# Patient Record
Sex: Female | Born: 1995 | Race: Black or African American | Hispanic: No | Marital: Single | Smoking: Never smoker
Health system: Southern US, Community
[De-identification: ages and names within clinical notes are randomized; demographics above are authoritative.]

## PROBLEM LIST (undated history)

## (undated) ENCOUNTER — Inpatient Hospital Stay (HOSPITAL_COMMUNITY): Payer: Self-pay

## (undated) ENCOUNTER — Ambulatory Visit (HOSPITAL_COMMUNITY): Admission: EM | Payer: Self-pay

## (undated) DIAGNOSIS — J40 Bronchitis, not specified as acute or chronic: Secondary | ICD-10-CM

## (undated) DIAGNOSIS — N390 Urinary tract infection, site not specified: Secondary | ICD-10-CM

## (undated) DIAGNOSIS — R519 Headache, unspecified: Secondary | ICD-10-CM

## (undated) DIAGNOSIS — J45909 Unspecified asthma, uncomplicated: Secondary | ICD-10-CM

## (undated) HISTORY — PX: NO PAST SURGERIES: SHX2092

---
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
## 2015-05-21 ENCOUNTER — Encounter (HOSPITAL_COMMUNITY): Payer: Self-pay | Admitting: *Deleted

## 2015-10-13 ENCOUNTER — Emergency Department (HOSPITAL_COMMUNITY)
Admission: EM | Admit: 2015-10-13 | Discharge: 2015-10-14 | Disposition: A | Discharge status: Home / Self Care | Payer: Medicaid Other | Attending: Emergency Medicine | Admitting: Emergency Medicine

## 2015-10-13 ENCOUNTER — Other Ambulatory Visit: Payer: Self-pay

## 2015-10-13 ENCOUNTER — Emergency Department (HOSPITAL_COMMUNITY): Payer: Medicaid Other

## 2015-10-13 ENCOUNTER — Encounter (HOSPITAL_COMMUNITY): Payer: Self-pay | Admitting: Emergency Medicine

## 2015-10-13 DIAGNOSIS — R51 Headache: Secondary | ICD-10-CM | POA: Insufficient documentation

## 2015-10-13 DIAGNOSIS — N390 Urinary tract infection, site not specified: Secondary | ICD-10-CM

## 2015-10-13 DIAGNOSIS — H539 Unspecified visual disturbance: Secondary | ICD-10-CM | POA: Diagnosis not present

## 2015-10-13 DIAGNOSIS — R55 Syncope and collapse: Secondary | ICD-10-CM | POA: Diagnosis present

## 2015-10-13 DIAGNOSIS — J45909 Unspecified asthma, uncomplicated: Secondary | ICD-10-CM | POA: Insufficient documentation

## 2015-10-13 DIAGNOSIS — Z87891 Personal history of nicotine dependence: Secondary | ICD-10-CM | POA: Insufficient documentation

## 2015-10-13 DIAGNOSIS — R42 Dizziness and giddiness: Secondary | ICD-10-CM | POA: Insufficient documentation

## 2015-10-13 DIAGNOSIS — Z79899 Other long term (current) drug therapy: Secondary | ICD-10-CM | POA: Insufficient documentation

## 2015-10-13 DIAGNOSIS — R519 Headache, unspecified: Secondary | ICD-10-CM

## 2015-10-13 LAB — BASIC METABOLIC PANEL
ANION GAP: 7 (ref 5–15)
BUN: 13 mg/dL (ref 6–20)
CO2: 26 mmol/L (ref 22–32)
Calcium: 9.1 mg/dL (ref 8.9–10.3)
Chloride: 101 mmol/L (ref 101–111)
Creatinine, Ser: 0.95 mg/dL (ref 0.44–1.00)
Glucose, Bld: 157 mg/dL — ABNORMAL HIGH (ref 65–99)
POTASSIUM: 3.7 mmol/L (ref 3.5–5.1)
Sodium: 134 mmol/L — ABNORMAL LOW (ref 135–145)

## 2015-10-13 LAB — CBC
HEMATOCRIT: 37.2 % (ref 36.0–46.0)
HEMOGLOBIN: 12.4 g/dL (ref 12.0–15.0)
MCH: 27.1 pg (ref 26.0–34.0)
MCHC: 33.3 g/dL (ref 30.0–36.0)
MCV: 81.2 fL (ref 78.0–100.0)
Platelets: 207 10*3/uL (ref 150–400)
RBC: 4.58 MIL/uL (ref 3.87–5.11)
RDW: 12.6 % (ref 11.5–15.5)
WBC: 5.2 10*3/uL (ref 4.0–10.5)

## 2015-10-13 LAB — URINALYSIS, ROUTINE W REFLEX MICROSCOPIC
Bilirubin Urine: NEGATIVE
Glucose, UA: NEGATIVE mg/dL
Ketones, ur: NEGATIVE mg/dL
NITRITE: POSITIVE — AB
Protein, ur: NEGATIVE mg/dL
SPECIFIC GRAVITY, URINE: 1.016 (ref 1.005–1.030)
pH: 7 (ref 5.0–8.0)

## 2015-10-13 LAB — CBG MONITORING, ED: GLUCOSE-CAPILLARY: 94 mg/dL (ref 65–99)

## 2015-10-13 LAB — URINE MICROSCOPIC-ADD ON: RBC / HPF: NONE SEEN RBC/hpf (ref 0–5)

## 2015-10-13 MED ORDER — KETOROLAC TROMETHAMINE 30 MG/ML IJ SOLN
30.0000 mg | Freq: Once | INTRAMUSCULAR | Status: AC
  Administered 2015-10-13: 30 mg via INTRAVENOUS
  Filled 2015-10-13: qty 1

## 2015-10-13 MED ORDER — ACETAMINOPHEN 325 MG PO TABS
650.0000 mg | ORAL_TABLET | Freq: Once | ORAL | Status: AC
  Administered 2015-10-14: 650 mg via ORAL
  Filled 2015-10-13: qty 2

## 2015-10-13 MED ORDER — DEXTROSE 5 % IV SOLN
1.0000 g | Freq: Once | INTRAVENOUS | Status: AC
  Administered 2015-10-13: 1 g via INTRAVENOUS
  Filled 2015-10-13: qty 10

## 2015-10-13 MED ORDER — SODIUM CHLORIDE 0.9 % IV BOLUS (SEPSIS)
1000.0000 mL | Freq: Once | INTRAVENOUS | Status: AC
  Administered 2015-10-13: 1000 mL via INTRAVENOUS

## 2015-10-13 NOTE — ED Provider Notes (Signed)
CSN: [id number]     Arrival date & time 10/13/15  1729 History   First MD Initiated Contact with Patient 10/13/15 2112     Chief Complaint  Patient presents with  . Near Syncope  . Migraine  . Fever   HPI  Ms. Jennifer Bird is a 20 year old female with PMHx of asthma presenting with headache and syncopal event. Pt reports onset of headache 4 days ago. The headache is generalized and described as "like a migraine". Pt is not able to better characterize the pain other than "it's just weird feeling, like it hurts real bad". The headache has been intermittent over the past few days without specific exacerbating factors. She notes that when she has the headache, it is worsened by leaning forward, lying flat and coughing. She denies associated neck pain or stiffness. She also complains of intermittent blurred vision. She states that the vision changes will last for a few seconds and she blinks to resolve it. She reports a fever of 100.5 yesterday and chills. 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 Her coworkers report that she then passed out and fell to the floor. Is is unclear how long the pt was unconsciousness for. Pt states coworkers told her she was unconscious for 30 minutes but she also reports that they were giving her cold water to drink after she passed out which she drank without issue. She also reports the coworkers performing CPR on her to "wake her up" but denies coworkers reporting her not breathing or pulseless. EMS was not called and pt went home. No change in her headaches after this event. She has not taken any OTC medications for her headaches. She endorses an associated dry cough over the past few days. Denies other symptoms of URI including ear pain, nasal congestion, rhinorrhea, sinus pressure or sore throat. Denies urinary symptoms including dysuria or increased urinary frequency. Denies nausea or vomiting. No other complaints today.   Past Medical History   Diagnosis Date  . Asthma   . Bronchitis    History reviewed. No pertinent past surgical history. No family history on file. Social History  Substance Use Topics  . Smoking status: Former Smoker    Types: Cigarettes  . Smokeless tobacco: None  . Alcohol Use: No   OB History    Gravida Para Term Preterm AB TAB SAB Ectopic Multiple Living   2              Review of Systems  Constitutional: Positive for fever and chills.  HENT: Negative for congestion, rhinorrhea and sore throat.   Eyes: Positive for visual disturbance.  Respiratory: Negative for cough.   Gastrointestinal: Negative for nausea, vomiting and abdominal pain.  Musculoskeletal: Negative for gait problem, neck pain and neck stiffness.  Skin: Negative for rash.  Neurological: Positive for syncope, light-headedness and headaches. Negative for seizures, facial asymmetry, speech difficulty, weakness and numbness.  Psychiatric/Behavioral: Negative for confusion.  All other systems reviewed and are negative.     Allergies  Other  Home Medications   Prior to Admission medications   Medication Sig Start Date End Date Taking? Authorizing Provider  albuterol (PROVENTIL HFA;VENTOLIN HFA) 108 (90 BASE) MCG/ACT inhaler Inhale 2 puffs into the lungs every 4 (four) hours as needed for wheezing or shortness of breath.   Yes Historical Provider, MD  Multiple Vitamin (MULTIVITAMIN WITH MINERALS) TABS tablet Take 1 tablet by mouth daily.   Yes Historical Provider, MD  cephALEXin (  KEFLEX) 500 MG capsule Take 1 capsule (500 mg total) by mouth 2 (two) times daily. 10/14/15   Rolm GalaStevi Lailie Smead, PA-C  Prenatal Vit-Fe Fumarate-FA (PRENATAL COMPLETE) 14-0.4 MG TABS Take 1 tablet by mouth daily. Patient not taking: Reported on 07/16/2014 06/21/14   Junius FinnerErin O'Malley, PA-C   BP 102/69 mmHg  Pulse 96  Temp(Src) 100.3 F (37.9 C) (Oral)  Resp 18  Ht 5\' 10"  (1.778 m)  Wt 84.369 kg  BMI 26.69 kg/m2  SpO2 99%  LMP 09/24/2015 Physical Exam   Constitutional: She is oriented to person, place, and time. She appears well-developed and well-nourished. No distress.  Nontoxic appearing  HENT:  Head: Normocephalic and atraumatic.  Mouth/Throat: Oropharynx is clear and moist. No oropharyngeal exudate.  Eyes: Conjunctivae and EOM are normal. Pupils are equal, round, and reactive to light. Right eye exhibits no discharge. Left eye exhibits no discharge.  Neck: Normal range of motion. Neck supple. No rigidity. No Brudzinski's sign and no Kernig's sign noted.  No nuchal rigidity. FROM of neck intact including rotation and chin to chest. Negative Brudzinki's and Kernig's.   Cardiovascular: Normal rate, regular rhythm and normal heart sounds.   Pulmonary/Chest: Effort normal and breath sounds normal. No respiratory distress.  Abdominal: Soft. There is no tenderness. There is no rebound and no guarding.  Musculoskeletal: Normal range of motion.  Neurological: She is alert and oriented to person, place, and time. No cranial nerve deficit. She exhibits normal muscle tone. Coordination normal.  Cranial nerves 3-12 tested and intact. 5/5 strength of all major muscle groups. Sensation to light touch intact throughout. Finger to nose coordinated. Walks with a steady gait unassisted.  Skin: Skin is warm and dry.  Psychiatric: She has a normal mood and affect. Her behavior is normal.  Nursing note and vitals reviewed.   ED Course  Procedures (including critical care time) Labs Review Labs Reviewed  BASIC METABOLIC PANEL - Abnormal; Notable for the following:    Sodium 134 (*)    Glucose, Bld 157 (*)    All other components within normal limits  URINALYSIS, ROUTINE W REFLEX MICROSCOPIC (NOT AT [facility]) - Abnormal; Notable for the following:    APPearance CLOUDY (*)    Hgb urine dipstick SMALL (*)    Nitrite POSITIVE (*)    Leukocytes, UA LARGE (*)    All other components within normal limits  URINE MICROSCOPIC-ADD ON - Abnormal; Notable for the  following:    Squamous Epithelial / LPF 0-5 (*)    Bacteria, UA FEW (*)    All other components within normal limits  CBC  PREGNANCY, URINE  CBG MONITORING, ED    Imaging Review Ct Head Wo Contrast  10/13/2015  CLINICAL DATA:  20 year old female with headache and syncope EXAM: CT HEAD WITHOUT CONTRAST TECHNIQUE: Contiguous axial images were obtained from the base of the skull through the vertex without intravenous contrast. COMPARISON:  None. FINDINGS: The ventricles and the sulci are appropriate in size for the patient's age. There is no intracranial hemorrhage. No midline shift or mass effect identified. The gray-white matter differentiation is preserved. The visualized paranasal sinuses and mastoid air cells are well aerated. The calvarium is intact. IMPRESSION: No acute intracranial pathology. Electronically Signed   By: Elgie CollardArash  Radparvar M.D.   On: 10/13/2015 22:03   I have personally reviewed and evaluated these images and lab results as part of my medical decision-making.   EKG Interpretation None      MDM   Final diagnoses:  UTI (lower urinary tract infection)  Headache, unspecified headache type   20 year old female presenting with low grade fever, headache and possible syncopal event yesterday. Low grade temperature of 100.1 on presentation. No antipyretics PTA. Non-focal neuro exam. No meningeal signs. FROM of neck intact. No rigidity. Abdomen is soft, non-tender. No leukocytosis. Blood work unremarkable. CT head negative. ECG without arrhythmia. UA positive for nitrites and large leuks. IVF and toradol given which pt reports improved her headache. Dose of rocephin given in ED. Will treat for UTI and have pt closely follow up with PCP in 3-4 days . 5+ minutes discussing strict return precautions with pt including worsening headache, neck pain, neck stiffness, persistent fever, recurrent vision changes, confusion, recurrent syncope, gait imbalance, weakness or any other new  symptoms. Pt states understanding and is stable for discharge.    Rolm Gala Irl Bodie, PA-C 10/14/15 1448  Benjiman Core, MD 10/15/15 [phone]

## 2015-10-13 NOTE — ED Notes (Signed)
Patient reports syncopal episode yesterday at work, LOC for 30 minutes coworkers reported. Patient c/o blurred vision and migraine x4 days, subjective fever, non productive cough.

## 2015-10-14 LAB — PREGNANCY, URINE: Preg Test, Ur: NEGATIVE

## 2015-10-14 MED ORDER — CEPHALEXIN 500 MG PO CAPS: 500.0000 mg | ORAL_CAPSULE | Freq: Two times a day (BID) | ORAL | Status: DC

## 2015-10-14 NOTE — Discharge Instructions (Signed)
Urinary Tract Infection Urinary tract infections (UTIs) can develop anywhere along your urinary tract. Your urinary tract is your body's drainage system for removing wastes and extra water. Your urinary tract includes two kidneys, two ureters, a bladder, and a urethra. Your kidneys are a pair of bean-shaped organs. Each kidney is about the size of your fist. They are located below your ribs, one on each side of your spine. CAUSES Infections are caused by microbes, which are microscopic organisms, including fungi, viruses, and bacteria. These organisms are so small that they can only be seen through a microscope. Bacteria are the microbes that most commonly cause UTIs. SYMPTOMS  Symptoms of UTIs may vary by age and gender of the patient and by the location of the infection. Symptoms in young women typically include a frequent and intense urge to urinate and a painful, burning feeling in the bladder or urethra during urination. Older women and men are more likely to be tired, shaky, and weak and have muscle aches and abdominal pain. A fever may mean the infection is in your kidneys. Other symptoms of a kidney infection include pain in your back or sides below the ribs, nausea, and vomiting. DIAGNOSIS To diagnose a UTI, your caregiver will ask you about your symptoms. Your caregiver will also ask you to provide a urine sample. The urine sample will be tested for bacteria and white blood cells. White blood cells are made by your body to help fight infection. TREATMENT  Typically, UTIs can be treated with medication. Because most UTIs are caused by a bacterial infection, they usually can be treated with the use of antibiotics. The choice of antibiotic and length of treatment depend on your symptoms and the type of bacteria causing your infection. HOME CARE INSTRUCTIONS  If you were prescribed antibiotics, take them exactly as your caregiver instructs you. Finish the medication even if you feel better after  you have only taken some of the medication.  Drink enough water and fluids to keep your urine clear or pale yellow.  Avoid caffeine, tea, and carbonated beverages. They tend to irritate your bladder.  Empty your bladder often. Avoid holding urine for long periods of time.  Empty your bladder before and after sexual intercourse.  After a bowel movement, women should cleanse from front to back. Use each tissue only once. SEEK MEDICAL CARE IF:   You have back pain.  You develop a fever.  Your symptoms do not begin to resolve within 3 days. SEEK IMMEDIATE MEDICAL CARE IF:   You have severe back pain or lower abdominal pain.  You develop chills.  You have nausea or vomiting.  You have continued burning or discomfort with urination. MAKE SURE YOU:   Understand these instructions.  Will watch your condition.  Will get help right away if you are not doing well or get worse.   This information is not intended to replace advice given to you by your health care provider. Make sure you discuss any questions you have with your health care provider.   Document Released: 01/17/2005 Document Revised: 12/29/2014 Document Reviewed: 05/18/2011 Elsevier Interactive Patient Education 2016 Elsevier Inc.  General Headache Without Cause A headache is pain or discomfort felt around the head or neck area. The specific cause of a headache may not be found. There are many causes and types of headaches. A few common ones are:  Tension headaches.  Migraine headaches.  Cluster headaches.  Chronic daily headaches. HOME CARE INSTRUCTIONS  Watch your condition  for any changes. Take these steps to help with your condition: Managing Pain  Take over-the-counter and prescription medicines only as told by your health care provider.  Lie down in a dark, quiet room when you have a headache.  If directed, apply ice to the head and neck area:  Put ice in a plastic bag.  Place a towel between your  skin and the bag.  Leave the ice on for 20 minutes, 2-3 times per day.  Use a heating pad or hot shower to apply heat to the head and neck area as told by your health care provider.  Keep lights dim if bright lights bother you or make your headaches worse. Eating and Drinking  Eat meals on a regular schedule.  Limit alcohol use.  Decrease the amount of caffeine you drink, or stop drinking caffeine. General Instructions  Keep all follow-up visits as told by your health care provider. This is important.  Keep a headache journal to help find out what may trigger your headaches. For example, write down:  What you eat and drink.  How much sleep you get.  Any change to your diet or medicines.  Try massage or other relaxation techniques.  Limit stress.  Sit up straight, and do not tense your muscles.  Do not use tobacco products, including cigarettes, chewing tobacco, or e-cigarettes. If you need help quitting, ask your health care provider.  Exercise regularly as told by your health care provider.  Sleep on a regular schedule. Get 7-9 hours of sleep, or the amount recommended by your health care provider. SEEK MEDICAL CARE IF:   Your symptoms are not helped by medicine.  You have a headache that is different from the usual headache.  You have nausea or you vomit.  You have a fever. SEEK IMMEDIATE MEDICAL CARE IF:   Your headache becomes severe.  You have repeated vomiting.  You have a stiff neck.  You have a loss of vision.  You have problems with speech.  You have pain in the eye or ear.  You have muscular weakness or loss of muscle control.  You lose your balance or have trouble walking.  You feel faint or pass out.  You have confusion.   This information is not intended to replace advice given to you by your health care provider. Make sure you discuss any questions you have with your health care provider.   Document Released: 04/09/2005 Document  Revised: 12/29/2014 Document Reviewed: 08/02/2014 Elsevier Interactive Patient Education [employer] Elsevier Inc.

## 2015-10-17 ENCOUNTER — Encounter (HOSPITAL_BASED_OUTPATIENT_CLINIC_OR_DEPARTMENT_OTHER): Payer: Self-pay | Admitting: Emergency Medicine

## 2016-01-16 ENCOUNTER — Encounter (HOSPITAL_COMMUNITY): Payer: Self-pay | Admitting: *Deleted

## 2016-01-16 ENCOUNTER — Inpatient Hospital Stay (HOSPITAL_COMMUNITY)
Admission: AD | Admit: 2016-01-16 | Discharge: 2016-01-16 | Disposition: A | Discharge status: Home / Self Care | Payer: Medicaid Other | Source: Ambulatory Visit | Attending: Family Medicine | Admitting: Family Medicine

## 2016-01-16 DIAGNOSIS — R102 Pelvic and perineal pain: Secondary | ICD-10-CM | POA: Insufficient documentation

## 2016-01-16 DIAGNOSIS — Z87891 Personal history of nicotine dependence: Secondary | ICD-10-CM | POA: Diagnosis not present

## 2016-01-16 DIAGNOSIS — A599 Trichomoniasis, unspecified: Secondary | ICD-10-CM

## 2016-01-16 DIAGNOSIS — A5901 Trichomonal vulvovaginitis: Secondary | ICD-10-CM | POA: Diagnosis not present

## 2016-01-16 DIAGNOSIS — Z3202 Encounter for pregnancy test, result negative: Secondary | ICD-10-CM | POA: Insufficient documentation

## 2016-01-16 LAB — URINALYSIS, ROUTINE W REFLEX MICROSCOPIC
Bilirubin Urine: NEGATIVE
GLUCOSE, UA: NEGATIVE mg/dL
Hgb urine dipstick: NEGATIVE
Ketones, ur: NEGATIVE mg/dL
LEUKOCYTES UA: NEGATIVE
Nitrite: NEGATIVE
PH: 6 (ref 5.0–8.0)
PROTEIN: NEGATIVE mg/dL
SPECIFIC GRAVITY, URINE: 1.02 (ref 1.005–1.030)

## 2016-01-16 LAB — CBC
HCT: 37 % (ref 36.0–46.0)
Hemoglobin: 12.9 g/dL (ref 12.0–15.0)
MCH: 27.7 pg (ref 26.0–34.0)
MCHC: 34.9 g/dL (ref 30.0–36.0)
MCV: 79.6 fL (ref 78.0–100.0)
PLATELETS: 247 10*3/uL (ref 150–400)
RBC: 4.65 MIL/uL (ref 3.87–5.11)
RDW: 12.9 % (ref 11.5–15.5)
WBC: 6.3 10*3/uL (ref 4.0–10.5)

## 2016-01-16 LAB — WET PREP, GENITAL
SPERM: NONE SEEN
Yeast Wet Prep HPF POC: NONE SEEN

## 2016-01-16 LAB — POCT PREGNANCY, URINE: Preg Test, Ur: NEGATIVE

## 2016-01-16 MED ORDER — METRONIDAZOLE 500 MG PO TABS
2000.0000 mg | ORAL_TABLET | Freq: Once | ORAL | Status: AC
  Administered 2016-01-16: 2000 mg via ORAL
  Filled 2016-01-16: qty 4

## 2016-01-16 MED ORDER — KETOROLAC TROMETHAMINE 60 MG/2ML IM SOLN
60.0000 mg | Freq: Once | INTRAMUSCULAR | Status: AC
  Administered 2016-01-16: 60 mg via INTRAMUSCULAR
  Filled 2016-01-16: qty 2

## 2016-01-16 NOTE — Discharge Instructions (Signed)
Trichomoniasis Trichomoniasis is an infection caused by an organism called Trichomonas. The infection can affect both women and men. In women, the outer female genitalia and the vagina are affected. In men, the penis is mainly affected, but the prostate and other reproductive organs can also be involved. Trichomoniasis is a sexually transmitted infection (STI) and is most often passed to another person through sexual contact.  RISK FACTORS  Having unprotected sexual intercourse.  Having sexual intercourse with an infected partner. SIGNS AND SYMPTOMS  Symptoms of trichomoniasis in women include:  Abnormal gray-green frothy vaginal discharge.  Itching and irritation of the vagina.  Itching and irritation of the area outside the vagina. Symptoms of trichomoniasis in men include:   Penile discharge with or without pain.  Pain during urination. This results from inflammation of the urethra. DIAGNOSIS  Trichomoniasis may be found during a Pap test or physical exam. Your health care provider may use one of the following methods to help diagnose this infection:  Testing the pH of the vagina with a test tape.  Using a vaginal swab test that checks for the Trichomonas organism. A test is available that provides results within a few minutes.  Examining a urine sample.  Testing vaginal secretions. Your health care provider may test you for other STIs, including HIV. TREATMENT   You may be given medicine to fight the infection. Women should inform their health care provider if they could be or are pregnant. Some medicines used to treat the infection should not be taken during pregnancy.  Your health care provider may recommend over-the-counter medicines or creams to decrease itching or irritation.  Your sexual partner will need to be treated if infected.  Your health care provider may test you for infection again 3 months after treatment. HOME CARE INSTRUCTIONS   Take medicines only as  directed by your health care provider.  Take over-the-counter medicine for itching or irritation as directed by your health care provider.  Do not have sexual intercourse while you have the infection.  Women should not douche or wear tampons while they have the infection.  Discuss your infection with your partner. Your partner may have gotten the infection from you, or you may have gotten it from your partner.  Have your sex partner get examined and treated if necessary.  Practice safe, informed, and protected sex.  See your health care provider for other STI testing. SEEK MEDICAL CARE IF:   You still have symptoms after you finish your medicine.  You develop abdominal pain.  You have pain when you urinate.  You have bleeding after sexual intercourse.  You develop a rash.  Your medicine makes you sick or makes you throw up (vomit). MAKE SURE YOU:  Understand these instructions.  Will watch your condition.  Will get help right away if you are not doing well or get worse.   This information is not intended to replace advice given to you by your health care provider. Make sure you discuss any questions you have with your health care provider.   Document Released: 10/03/2000 Document Revised: 04/30/2014 Document Reviewed: 01/19/2013 Elsevier Interactive Patient Education 2016 Elsevier Inc.  

## 2016-01-16 NOTE — MAU Provider Note (Signed)
History     CSN: [id number]  Arrival date and time: 01/16/16 8657   First Provider Initiated Contact with Patient 01/16/16 2054      Chief Complaint  Patient presents with  . Pelvic Pain   Pelvic Pain  The patient's primary symptoms include pelvic pain. This is a new problem. The current episode started in the past 7 days. The problem occurs intermittently. The problem has been unchanged. Pain severity now: 6/10  The problem affects the right side. She is not pregnant. Associated symptoms include abdominal pain, nausea and vomiting. Pertinent negatives include no chills, constipation, diarrhea, dysuria, fever, frequency or urgency. The vaginal discharge was normal. There has been no bleeding. Nothing aggravates the symptoms. She has tried nothing for the symptoms. She is sexually active. She uses nothing for contraception. Her menstrual history has been regular (LMP: 12/11/15 ).    Past Medical History:  Diagnosis Date  . Asthma   . Bronchitis     History reviewed. No pertinent surgical history.  History reviewed. No pertinent family history.  Social History  Substance Use Topics  . Smoking status: Former Smoker    Types: Cigarettes  . Smokeless tobacco: Never Used  . Alcohol use No    Allergies:  Allergies  Allergen Reactions  . Other Anaphylaxis    Ketchup, mustard, mayo    Prescriptions Prior to Admission  Medication Sig Dispense Refill Last Dose  . albuterol (PROVENTIL HFA;VENTOLIN HFA) 108 (90 BASE) MCG/ACT inhaler Inhale 2 puffs into the lungs every 4 (four) hours as needed for wheezing or shortness of breath.   Past Month at Unknown time  . cephALEXin (KEFLEX) 500 MG capsule Take 1 capsule (500 mg total) by mouth 2 (two) times daily. 14 capsule 0   . Multiple Vitamin (MULTIVITAMIN WITH MINERALS) TABS tablet Take 1 tablet by mouth daily.   Past Month at Unknown time  . Prenatal Vit-Fe Fumarate-FA (PRENATAL COMPLETE) 14-0.4 MG TABS Take 1 tablet by mouth daily.  (Patient not taking: Reported on 07/16/2014) 30 each 0 Not Taking at Unknown time    Review of Systems  Constitutional: Negative for chills and fever.  Gastrointestinal: Positive for abdominal pain, nausea and vomiting. Negative for constipation and diarrhea.  Genitourinary: Positive for pelvic pain. Negative for dysuria, frequency and urgency.   Physical Exam   Blood pressure 117/72, pulse 79, temperature 99.4 F (37.4 C), temperature source Oral, resp. rate 18, height 5\' 9"  (1.753 m), weight 199 lb (90.3 kg), last menstrual period 12/11/2015, SpO2 100 %, unknown if currently breastfeeding.  Physical Exam  Nursing note and vitals reviewed. Constitutional: She is oriented to person, place, and time. She appears well-developed and well-nourished. No distress.  HENT:  Head: Normocephalic.  Cardiovascular: Normal rate.   Respiratory: Effort normal.  GI: Soft. There is no tenderness. There is no rebound.  Genitourinary:  Genitourinary Comments:  External: no lesion Vagina: large amount of white discharge Cervix: pink, smooth, no CMT Uterus: NSSC Adnexa: NT   Neurological: She is alert and oriented to person, place, and time.  Skin: Skin is warm and dry.  Psychiatric: She has a normal mood and affect.   Results for orders placed or performed during the hospital encounter of 01/16/16 (from the past 24 hour(s))  Urinalysis, Routine w reflex microscopic (not at [facility])     Status: None   Collection Time: 01/16/16  8:07 PM  Result Value Ref Range   Color, Urine YELLOW YELLOW   APPearance CLEAR CLEAR  Specific Gravity, Urine 1.020 1.005 - 1.030   pH 6.0 5.0 - 8.0   Glucose, UA NEGATIVE NEGATIVE mg/dL   Hgb urine dipstick NEGATIVE NEGATIVE   Bilirubin Urine NEGATIVE NEGATIVE   Ketones, ur NEGATIVE NEGATIVE mg/dL   Protein, ur NEGATIVE NEGATIVE mg/dL   Nitrite NEGATIVE NEGATIVE   Leukocytes, UA NEGATIVE NEGATIVE  Pregnancy, urine POC     Status: None   Collection Time: 01/16/16   8:11 PM  Result Value Ref Range   Preg Test, Ur NEGATIVE NEGATIVE  CBC     Status: None   Collection Time: 01/16/16  8:59 PM  Result Value Ref Range   WBC 6.3 4.0 - 10.5 K/uL   RBC 4.65 3.87 - 5.11 MIL/uL   Hemoglobin 12.9 12.0 - 15.0 g/dL   HCT 16.137.0 09.636.0 - 04.546.0 %   MCV 79.6 78.0 - 100.0 fL   MCH 27.7 26.0 - 34.0 pg   MCHC 34.9 30.0 - 36.0 g/dL   RDW 40.912.9 81.111.5 - 91.415.5 %   Platelets 247 150 - 400 K/uL  Wet prep, genital     Status: Abnormal   Collection Time: 01/16/16  9:00 PM  Result Value Ref Range   Yeast Wet Prep HPF POC NONE SEEN NONE SEEN   Trich, Wet Prep PRESENT (A) NONE SEEN   Clue Cells Wet Prep HPF POC PRESENT (A) NONE SEEN   WBC, Wet Prep HPF POC MODERATE (A) NONE SEEN   Sperm NONE SEEN     MAU Course  Procedures  MDM Patient treated in MAU with 2g of flagyl.   Assessment and Plan   1. Trichimoniasis   2. Pelvic pain in female    DC home Comfort measures reviewed  RX: none, discussed partner treatment. Patient declines rx for partner today, and plans to have partner FU with health department   Follow-up Information    [facility]   Contact information: [address] [place] [id number] [phone]            Tawnya CrookHogan, Ryder Man Donovan 01/16/2016, 8:56 PM

## 2016-01-16 NOTE — MAU Note (Signed)
Pt reports she has had vomiting off/on since Saturday, also reports she has been having lower abd pain off/on

## 2016-01-17 LAB — GC/CHLAMYDIA PROBE AMP (~~LOC~~) NOT AT ARMC
CHLAMYDIA, DNA PROBE: POSITIVE — AB
NEISSERIA GONORRHEA: NEGATIVE

## 2016-01-17 LAB — RPR: RPR Ser Ql: NONREACTIVE

## 2016-01-17 LAB — HIV ANTIBODY (ROUTINE TESTING W REFLEX): HIV Screen 4th Generation wRfx: NONREACTIVE

## 2016-01-18 ENCOUNTER — Telehealth: Payer: Self-pay | Admitting: *Deleted

## 2016-01-18 DIAGNOSIS — A749 Chlamydial infection, unspecified: Secondary | ICD-10-CM

## 2016-01-18 MED ORDER — AZITHROMYCIN 250 MG PO TABS: 1000.0000 mg | ORAL_TABLET | Freq: Once | ORAL | 0 refills | Status: AC

## 2016-01-18 NOTE — Telephone Encounter (Signed)
Pt positive for chlamydia. STD Card completed. Rx to pharmacy. Called patient and informed her of results.

## 2016-04-18 ENCOUNTER — Encounter: Payer: Medicaid Other | Admitting: Obstetrics and Gynecology

## 2016-04-18 DIAGNOSIS — Z3490 Encounter for supervision of normal pregnancy, unspecified, unspecified trimester: Secondary | ICD-10-CM | POA: Insufficient documentation

## 2016-05-08 ENCOUNTER — Encounter: Payer: Medicaid Other | Admitting: Obstetrics & Gynecology

## 2016-06-21 ENCOUNTER — Encounter (HOSPITAL_COMMUNITY): Payer: Self-pay

## 2016-06-21 ENCOUNTER — Emergency Department (HOSPITAL_COMMUNITY)
Admission: EM | Admit: 2016-06-21 | Discharge: 2016-06-21 | Disposition: A | Discharge status: Home / Self Care | Payer: Medicaid Other | Attending: Emergency Medicine | Admitting: Emergency Medicine

## 2016-06-21 DIAGNOSIS — L270 Generalized skin eruption due to drugs and medicaments taken internally: Secondary | ICD-10-CM | POA: Insufficient documentation

## 2016-06-21 DIAGNOSIS — Z87891 Personal history of nicotine dependence: Secondary | ICD-10-CM | POA: Diagnosis not present

## 2016-06-21 DIAGNOSIS — Z79899 Other long term (current) drug therapy: Secondary | ICD-10-CM | POA: Diagnosis not present

## 2016-06-21 DIAGNOSIS — R21 Rash and other nonspecific skin eruption: Secondary | ICD-10-CM | POA: Diagnosis present

## 2016-06-21 DIAGNOSIS — J45909 Unspecified asthma, uncomplicated: Secondary | ICD-10-CM | POA: Diagnosis not present

## 2016-06-21 MED ORDER — DIPHENHYDRAMINE HCL 25 MG PO CAPS: 25.0000 mg | ORAL_CAPSULE | Freq: Four times a day (QID) | ORAL | 0 refills | Status: DC | PRN

## 2016-06-21 MED ORDER — HYDROXYZINE HCL 10 MG PO TABS: 10.0000 mg | ORAL_TABLET | Freq: Four times a day (QID) | ORAL | 0 refills | Status: DC | PRN

## 2016-06-21 NOTE — ED Triage Notes (Signed)
Pt reports she took Tylenol 3 and woke up with rash on her face. Some redness noted around the face. She denies shortness of breath. Small amount of redness noted to the chest. Airway intact.

## 2016-06-21 NOTE — Discharge Instructions (Signed)
Take your medications as prescribed as needed for itching/rash. I recommend refraining from taking Tylenol 3 due to your suspected drug reaction. Please return to the Emergency Department if symptoms worsen or new onset of fever, facial/neck swelling, difficulty breathing, sensation of throat closing, swelling of lips/tongue/throat, vomiting.

## 2016-06-21 NOTE — ED Provider Notes (Signed)
[facility]-EMERGENCY DEPT Provider Note   CSN: [id number] Arrival date & time: 06/21/16  1814   By signing my name below, I, Soijett Blue, attest that this documentation has been prepared under the direction and in the presence of Melburn HakeNicole Nadeau, PA-C Electronically Signed: Soijett Blue, ED Scribe. 06/21/16. 7:45 PM.  History   Chief Complaint Chief Complaint  Patient presents with  . Rash    HPI Jennifer Bird is a 21 y.o. female who presents to the Emergency Department complaining of pruritic rash onset 2 PM. She reports associated burning sensation to face and upper chest, redness to face and upper chest, and resolved vomiting. Pt has not tried any medications for her symptoms. She notes that she took a friends prescription Tylenol-3 for abdominal cramping related to her menstrual cycle prior to the onset of her symptoms. Pt states that she noticed the rash to her face and upper chest upon waking from a nap.  Pt denies new soaps, pets, environment, lotion, or detergent. She denies fever, SOB, throat closing sensation, tongue swelling, neck swelling and any other symptoms. Denies contacts with similar symptoms, PMHx, or taking daily medications.     The history is provided by the patient. No language interpreter was used.    Past Medical History:  Diagnosis Date  . Asthma   . Bronchitis     Patient Active Problem List   Diagnosis Date Noted  . Supervision of normal pregnancy, antepartum 04/18/2016    History reviewed. No pertinent surgical history.  OB History    Gravida Para Term Preterm AB Living   2             SAB TAB Ectopic Multiple Live Births                   Home Medications    Prior to Admission medications   Medication Sig Start Date End Date Taking? Authorizing Provider  albuterol (PROVENTIL HFA;VENTOLIN HFA) 108 (90 BASE) MCG/ACT inhaler Inhale 2 puffs into the lungs every 4 (four) hours as needed for wheezing or shortness of breath.    Historical Provider,  MD  diphenhydrAMINE (BENADRYL) 25 mg capsule Take 1 capsule (25 mg total) by mouth every 6 (six) hours as needed. 06/21/16   Barrett HenleNicole Elizabeth Nadeau, PA-C  hydrOXYzine (ATARAX/VISTARIL) 10 MG tablet Take 1 tablet (10 mg total) by mouth every 6 (six) hours as needed for itching. 06/21/16   Barrett HenleNicole Elizabeth Nadeau, PA-C    Family History History reviewed. No pertinent family history.  Social History Social History  Substance Use Topics  . Smoking status: Former Smoker    Types: Cigarettes  . Smokeless tobacco: Never Used  . Alcohol use No     Allergies   Other   Review of Systems Review of Systems  Constitutional: Negative for fever.  HENT: Negative for trouble swallowing.        +Burning sensation to face and neck. No throat closing sensation, throat swelling, or neck swelling.  Respiratory: Negative for shortness of breath.   Gastrointestinal: Positive for vomiting (resolved).  Skin: Positive for color change (redness to face and upper chest) and rash (face and upper chest).     Physical Exam Updated Vital Signs BP 120/83 (BP Location: Left Arm)   Pulse 81   Temp 98.6 F (37 C) (Oral)   Resp 18   SpO2 100%   Physical Exam  Constitutional: She is oriented to person, place, and time. She appears well-developed and well-nourished.  HENT:  Head: Normocephalic and atraumatic.  Mouth/Throat: Oropharynx is clear and moist. No oropharyngeal exudate.  No trismus, drooling, facial/neck swelling or stridor on exam. No muffled voice. Floor of mouth soft.    Eyes: Conjunctivae and EOM are normal. Right eye exhibits no discharge. Left eye exhibits no discharge. No scleral icterus.  Neck: Normal range of motion. Neck supple.  Cardiovascular: Normal rate, regular rhythm, normal heart sounds and intact distal pulses.   Pulmonary/Chest: Effort normal and breath sounds normal. No respiratory distress. She has no wheezes. She has no rales. She exhibits no tenderness.  Abdominal: Soft.  Bowel sounds are normal. There is no tenderness.  Musculoskeletal: She exhibits no edema.  Lymphadenopathy:    She has no cervical adenopathy.  Neurological: She is alert and oriented to person, place, and time.  Skin: Skin is warm and dry. Rash noted. No lesion noted. Rash is maculopapular. Rash is not pustular and not vesicular. There is erythema.  Mild erythematous blanching maculopapular rash noted to bilateral facial maxillary region and upper anterior chest. No vesicles, pustules, bulla, or drainage. No lesions on palms or soles.   Nursing note and vitals reviewed.    ED Treatments / Results  DIAGNOSTIC STUDIES: Oxygen Saturation is 100% on RA, nl by my interpretation.    COORDINATION OF CARE: 7:40 PM Discussed treatment plan with pt at bedside which includes atarax Rx, benadryl Rx, and pt agreed to plan.  Procedures Procedures (including critical care time)  Medications Ordered in ED Medications - No data to display   Initial Impression / Assessment and Plan / ED Course  I have reviewed the triage vital signs and the nursing notes.  Rash consistent with drug reaction. Patient denies any difficulty breathing or swallowing.  Pt has a patent airway without stridor and is handling secretions without difficulty; no angioedema. No blisters, no pustules, no warmth, no draining sinus tracts, no superficial abscesses, no bullous impetigo, no vesicles, no desquamation, no target lesions with dusky purpura or a central bulla. Not tender to touch. No concern for superimposed infection. No concern for SJS, TEN, TSS, tick borne illness, syphilis or other life-threatening condition. Will discharge home with vistaril and recommend Benadryl as needed for pruritis. Discussed return precautions.    Final Clinical Impressions(s) / ED Diagnoses   Final diagnoses:  Drug rash    New Prescriptions New Prescriptions   DIPHENHYDRAMINE (BENADRYL) 25 MG CAPSULE    Take 1 capsule (25 mg total) by  mouth every 6 (six) hours as needed.   HYDROXYZINE (ATARAX/VISTARIL) 10 MG TABLET    Take 1 tablet (10 mg total) by mouth every 6 (six) hours as needed for itching.   I personally performed the services described in this documentation, which was scribed in my presence. The recorded information has been reviewed and is accurate.     Satira [place], [place] 06/21/16 1948    Gerhard Munch, MD 06/21/16 2036

## 2016-06-22 ENCOUNTER — Encounter (HOSPITAL_COMMUNITY): Payer: Self-pay

## 2016-06-22 DIAGNOSIS — Z79899 Other long term (current) drug therapy: Secondary | ICD-10-CM | POA: Insufficient documentation

## 2016-06-22 DIAGNOSIS — T7840XA Allergy, unspecified, initial encounter: Secondary | ICD-10-CM

## 2016-06-22 DIAGNOSIS — Z5321 Procedure and treatment not carried out due to patient leaving prior to being seen by health care provider: Secondary | ICD-10-CM | POA: Insufficient documentation

## 2016-06-22 DIAGNOSIS — J45909 Unspecified asthma, uncomplicated: Secondary | ICD-10-CM | POA: Diagnosis not present

## 2016-06-22 DIAGNOSIS — Z87891 Personal history of nicotine dependence: Secondary | ICD-10-CM | POA: Insufficient documentation

## 2016-06-22 DIAGNOSIS — J45998 Other asthma: Secondary | ICD-10-CM

## 2016-06-22 DIAGNOSIS — T7840XD Allergy, unspecified, subsequent encounter: Secondary | ICD-10-CM | POA: Diagnosis not present

## 2016-06-22 NOTE — ED Triage Notes (Signed)
Pt states seen here yesterday for allergic reaction. Pt states told to return if lips began swelling. Pt complaining of lip swelling this evening. Pt with no obvious swelling to face. Pt with no SOB. Airway intact.

## 2016-06-23 ENCOUNTER — Encounter (HOSPITAL_COMMUNITY): Payer: Self-pay | Admitting: Emergency Medicine

## 2016-06-23 ENCOUNTER — Emergency Department (HOSPITAL_COMMUNITY)
Admission: EM | Admit: 2016-06-23 | Discharge: 2016-06-23 | Disposition: A | Discharge status: Home / Self Care | Payer: Medicaid Other | Attending: Emergency Medicine | Admitting: Emergency Medicine

## 2016-06-23 ENCOUNTER — Emergency Department (HOSPITAL_COMMUNITY)
Admission: EM | Admit: 2016-06-23 | Discharge: 2016-06-23 | Disposition: A | Discharge status: Home / Self Care | Payer: Medicaid Other | Source: Home / Self Care

## 2016-06-23 DIAGNOSIS — Z87891 Personal history of nicotine dependence: Secondary | ICD-10-CM | POA: Insufficient documentation

## 2016-06-23 DIAGNOSIS — J45909 Unspecified asthma, uncomplicated: Secondary | ICD-10-CM | POA: Insufficient documentation

## 2016-06-23 DIAGNOSIS — T7840XD Allergy, unspecified, subsequent encounter: Secondary | ICD-10-CM | POA: Insufficient documentation

## 2016-06-23 MED ORDER — PREDNISONE 20 MG PO TABS: 60.0000 mg | ORAL_TABLET | Freq: Every day | ORAL | 0 refills | Status: DC

## 2016-06-23 NOTE — ED Provider Notes (Signed)
[facility]-EMERGENCY DEPT Provider Note   CSN: [id number] Arrival date & time: 06/23/16  0136     History   Chief Complaint Chief Complaint  Patient presents with  . Allergic Reaction    HPI Jennifer Bird is a 21 y.o. female.  Patient presents with concern for worsening allergic reaction. She was seen yesterday for rash that was determined to be associated with recent use of codeine. She was discharged home on Vistaril and benadryl. Tonight she felt that her throat was tightening and there was some swelling in her lips so she came for re-evaluation. She reports that her new symptoms have completely resolved.     The history is provided by the patient. No language interpreter was used.  Allergic Reaction  Presenting symptoms: no rash     Past Medical History:  Diagnosis Date  . Asthma   . Bronchitis     Patient Active Problem List   Diagnosis Date Noted  . Supervision of normal pregnancy, antepartum 04/18/2016    History reviewed. No pertinent surgical history.  OB History    Gravida Para Term Preterm AB Living   2             SAB TAB Ectopic Multiple Live Births                   Home Medications    Prior to Admission medications   Medication Sig Start Date End Date Taking? Authorizing Provider  albuterol (PROVENTIL HFA;VENTOLIN HFA) 108 (90 BASE) MCG/ACT inhaler Inhale 2 puffs into the lungs every 4 (four) hours as needed for wheezing or shortness of breath.    Historical Provider, MD  diphenhydrAMINE (BENADRYL) 25 mg capsule Take 1 capsule (25 mg total) by mouth every 6 (six) hours as needed. 06/21/16   Barrett HenleNicole Elizabeth Nadeau, PA-C  hydrOXYzine (ATARAX/VISTARIL) 10 MG tablet Take 1 tablet (10 mg total) by mouth every 6 (six) hours as needed for itching. 06/21/16   Barrett HenleNicole Elizabeth Nadeau, PA-C    Family History History reviewed. No pertinent family history.  Social History Social History  Substance Use Topics  . Smoking status: Former Smoker    Types:  Cigarettes  . Smokeless tobacco: Never Used  . Alcohol use No     Allergies   Codeine and Other   Review of Systems Review of Systems  Constitutional: Negative for chills and fever.  HENT: Positive for facial swelling.        See HPI.  Respiratory: Negative.  Negative for shortness of breath and stridor.   Cardiovascular: Negative.   Gastrointestinal: Negative.  Negative for nausea.  Skin: Negative.  Negative for rash.  Neurological: Negative.      Physical Exam Updated Vital Signs BP 107/81 (BP Location: Left Arm)   Pulse 77   Temp 98.6 F (37 C) (Oral)   Resp 13   Ht 5\' 9"  (1.753 m)   Wt 93.9 kg   LMP 06/22/2016 (Exact Date)   SpO2 100%   BMI 30.57 kg/m   Physical Exam  Constitutional: She is oriented to person, place, and time. She appears well-developed and well-nourished.  HENT:  Head: Normocephalic.  No swelling of lips.  Neck: Normal range of motion. Neck supple.  Cardiovascular: Normal rate and regular rhythm.   Pulmonary/Chest: Effort normal and breath sounds normal. No stridor. She has no wheezes. She has no rales.  Musculoskeletal: Normal range of motion.  Neurological: She is alert and oriented to person, place, and time.  Skin: Skin  is warm and dry. No rash noted.  Psychiatric: She has a normal mood and affect.     ED Treatments / Results  Labs (all labs ordered are listed, but only abnormal results are displayed) Labs Reviewed - No data to display  EKG  EKG Interpretation None       Radiology No results found.  Procedures Procedures (including critical care time)  Medications Ordered in ED Medications - No data to display   Initial Impression / Assessment and Plan / ED Course  I have reviewed the triage vital signs and the nursing notes.  Pertinent labs & imaging results that were available during my care of the patient were reviewed by me and considered in my medical decision making (see chart for details).     The  patient felt her symptoms of allergic reaction were worsening so she came back to ER. She is feeling asymptomatic at this time. She can be discharged home but will add prednisone to her regimen.   Final Clinical Impressions(s) / ED Diagnoses   Final diagnoses:  None   1. Allergic reaction, subsequent encounter  New Prescriptions New Prescriptions   No medications on file     Elpidio Anis, PA-C 06/23/16 0345    Arby Barrette, MD 06/29/16 [phone]

## 2016-06-23 NOTE — ED Notes (Signed)
Called pt for room did not answer.

## 2016-06-23 NOTE — ED Notes (Signed)
Patient seen leaving ED and getting in to car, did not speak to nurse or staff.  Patient recently updated on wait time and reassessed.

## 2016-06-23 NOTE — ED Triage Notes (Addendum)
Pt states that [facility] for [facility]. Pt states that she was told to return if lips began swelling. Patient is complaining of lip swelling tonight. Patient has no swelling at this time. Patient is breathing without any difficulty. Patient was told she had an allergic reaction to the codeine that she had taken about 4 days ago.

## 2016-07-23 ENCOUNTER — Emergency Department (HOSPITAL_COMMUNITY): Payer: Medicaid Other

## 2016-07-23 ENCOUNTER — Encounter (HOSPITAL_COMMUNITY): Payer: Self-pay | Admitting: Emergency Medicine

## 2016-07-23 ENCOUNTER — Emergency Department (HOSPITAL_COMMUNITY)
Admission: EM | Admit: 2016-07-23 | Discharge: 2016-07-23 | Disposition: A | Discharge status: Home / Self Care | Payer: Medicaid Other | Attending: Emergency Medicine | Admitting: Emergency Medicine

## 2016-07-23 DIAGNOSIS — J45909 Unspecified asthma, uncomplicated: Secondary | ICD-10-CM | POA: Diagnosis not present

## 2016-07-23 DIAGNOSIS — R109 Unspecified abdominal pain: Secondary | ICD-10-CM

## 2016-07-23 DIAGNOSIS — R102 Pelvic and perineal pain: Secondary | ICD-10-CM | POA: Diagnosis present

## 2016-07-23 DIAGNOSIS — Z87891 Personal history of nicotine dependence: Secondary | ICD-10-CM | POA: Insufficient documentation

## 2016-07-23 DIAGNOSIS — N946 Dysmenorrhea, unspecified: Secondary | ICD-10-CM | POA: Diagnosis not present

## 2016-07-23 LAB — URINALYSIS, ROUTINE W REFLEX MICROSCOPIC
Bilirubin Urine: NEGATIVE
Glucose, UA: NEGATIVE mg/dL
KETONES UR: NEGATIVE mg/dL
Leukocytes, UA: NEGATIVE
Nitrite: NEGATIVE
PROTEIN: NEGATIVE mg/dL
Specific Gravity, Urine: 1.027 (ref 1.005–1.030)
pH: 5 (ref 5.0–8.0)

## 2016-07-23 LAB — CBC WITH DIFFERENTIAL/PLATELET
Basophils Absolute: 0 10*3/uL (ref 0.0–0.1)
Basophils Relative: 0 %
EOS ABS: 0 10*3/uL (ref 0.0–0.7)
EOS PCT: 0 %
HCT: 37.1 % (ref 36.0–46.0)
HEMOGLOBIN: 13 g/dL (ref 12.0–15.0)
Lymphocytes Relative: 22 %
Lymphs Abs: 1.5 10*3/uL (ref 0.7–4.0)
MCH: 27.7 pg (ref 26.0–34.0)
MCHC: 35 g/dL (ref 30.0–36.0)
MCV: 78.9 fL (ref 78.0–100.0)
Monocytes Absolute: 0.4 10*3/uL (ref 0.1–1.0)
Monocytes Relative: 5 %
NEUTROS PCT: 73 %
Neutro Abs: 5 10*3/uL (ref 1.7–7.7)
PLATELETS: 242 10*3/uL (ref 150–400)
RBC: 4.7 MIL/uL (ref 3.87–5.11)
RDW: 12.4 % (ref 11.5–15.5)
WBC: 6.9 10*3/uL (ref 4.0–10.5)

## 2016-07-23 LAB — COMPREHENSIVE METABOLIC PANEL
ALBUMIN: 4.3 g/dL (ref 3.5–5.0)
ALT: 14 U/L (ref 14–54)
AST: 17 U/L (ref 15–41)
Alkaline Phosphatase: 64 U/L (ref 38–126)
Anion gap: 6 (ref 5–15)
BILIRUBIN TOTAL: 0.6 mg/dL (ref 0.3–1.2)
BUN: 11 mg/dL (ref 6–20)
CO2: 27 mmol/L (ref 22–32)
CREATININE: 0.81 mg/dL (ref 0.44–1.00)
Calcium: 9.5 mg/dL (ref 8.9–10.3)
Chloride: 106 mmol/L (ref 101–111)
GFR calc Af Amer: >60 mL/min (ref >60)
GLUCOSE: 128 mg/dL — AB (ref 65–99)
POTASSIUM: 3.6 mmol/L (ref 3.5–5.1)
Sodium: 139 mmol/L (ref 135–145)
Total Protein: 7.6 g/dL (ref 6.5–8.1)

## 2016-07-23 LAB — LIPASE, BLOOD: Lipase: 32 U/L (ref 11–51)

## 2016-07-23 LAB — I-STAT BETA HCG BLOOD, ED (MC, WL, AP ONLY): I-stat hCG, quantitative: <5 m[IU]/mL (ref <5)

## 2016-07-23 MED ORDER — IBUPROFEN 800 MG PO TABS
800.0000 mg | ORAL_TABLET | Freq: Once | ORAL | Status: AC
  Administered 2016-07-23: 800 mg via ORAL
  Filled 2016-07-23: qty 1

## 2016-07-23 NOTE — ED Provider Notes (Signed)
[facility]-EMERGENCY DEPT Provider Note   CSN: [id number] Arrival date & time: 07/23/16  9562     History   Chief Complaint Chief Complaint  Patient presents with  . Abdominal Pain    HPI Jennifer Bird is a 21 y.o. female.  HPI   21 year old female presents today with complaints of abdominal pain.  Patient reports she started her menstrual cycle last night with vaginal bleeding and abdominal cramping.  She notes this was normal for her menstrual cycles.  She notes at 1 AM this morning she had lower pelvic pain that was more severe than her previous abdominal cramping.  She denies any preceding vaginal discharge, reports one episode of vomiting, denies any upper abdominal pain, fever.  Patient reports symptoms are improving at the time of evaluation.  Patient reports she is supposed to go into work today, but did not feel that she would be able to come in and was requested to come to the emergency room for a work note.     Past Medical History:  Diagnosis Date  . Asthma   . Bronchitis     Patient Active Problem List   Diagnosis Date Noted  . Supervision of normal pregnancy, antepartum 04/18/2016    History reviewed. No pertinent surgical history.  OB History    Gravida Para Term Preterm AB Living   2             SAB TAB Ectopic Multiple Live Births                  Home Medications    Prior to Admission medications   Medication Sig Start Date End Date Taking? Authorizing Provider  albuterol (PROVENTIL HFA;VENTOLIN HFA) 108 (90 BASE) MCG/ACT inhaler Inhale 2 puffs into the lungs every 4 (four) hours as needed for wheezing or shortness of breath.   Yes Historical Provider, MD  diphenhydrAMINE (BENADRYL) 25 mg capsule Take 1 capsule (25 mg total) by mouth every 6 (six) hours as needed. Patient not taking: Reported on 07/23/2016 06/21/16   Barrett Henle, PA-C  hydrOXYzine (ATARAX/VISTARIL) 10 MG tablet Take 1 tablet (10 mg total) by mouth every 6 (six) hours as needed  for itching. Patient not taking: Reported on 07/23/2016 06/21/16   Barrett Henle, PA-C  predniSONE (DELTASONE) 20 MG tablet Take 3 tablets (60 mg total) by mouth daily. Patient not taking: Reported on 07/23/2016 06/23/16   Elpidio Anis, PA-C    Family History History reviewed. No pertinent family history.  Social History Social History  Substance Use Topics  . Smoking status: Former Smoker    Types: Cigarettes  . Smokeless tobacco: Never Used  . Alcohol use No     Allergies   Codeine and Other   Review of Systems Review of Systems  All other systems reviewed and are negative.  Physical Exam Updated Vital Signs BP 113/68   Pulse 72   Temp 98.4 F (36.9 C) (Oral)   Resp 18   Ht  (1.753 m)   Wt 83.9 kg   SpO2 100%   BMI 27.32 kg/m   Physical Exam  Constitutional: She is oriented to person, place, and time. She appears well-developed and well-nourished.  HENT:  Head: Normocephalic and atraumatic.  Eyes: Conjunctivae are normal. Pupils are equal, round, and reactive to light. Right eye exhibits no discharge. Left eye exhibits no discharge. No scleral icterus.  Neck: Normal range of motion. No JVD present. No tracheal deviation present.  Pulmonary/Chest: Effort normal.  No stridor.  Abdominal: Soft.   Minor TTP of bilateral lower pelvis - remainder of abd soft NTTP  Neurological: She is alert and oriented to person, place, and time. Coordination normal.  Psychiatric: She has a normal mood and affect. Her behavior is normal. Judgment and thought content normal.  Nursing note and vitals reviewed.   ED Treatments / Results  Labs (all labs ordered are listed, but only abnormal results are displayed) Labs Reviewed  COMPREHENSIVE METABOLIC PANEL - Abnormal; Notable for the following:       Result Value   Glucose, Bld 128 (*)    All other components within normal limits  URINALYSIS, ROUTINE W REFLEX MICROSCOPIC - Abnormal; Notable for the following:     APPearance HAZY (*)    Hgb urine dipstick LARGE (*)    Bacteria, UA RARE (*)    Squamous Epithelial / LPF 0-5 (*)    All other components within normal limits  LIPASE, BLOOD  CBC WITH DIFFERENTIAL/PLATELET  I-STAT BETA HCG BLOOD, ED ([facility], [facility], [facility] ONLY)    EKG  EKG Interpretation None       Radiology US Transvaginal Non-ob  Result Date: 07/23/2016 CLINICAL DATA:  Acute midline pelvic pain. Onset of last normal menstrual period was July 23, 2016. EXAM: TRANSABDOMINAL AND TRANSVAGINAL ULTRASOUND OF PELVIS DOPPLER ULTRASOUND OF OVARIES TECHNIQUE: Both transabdominal and transvaginal ultrasound examinations of the pelvis were performed. Transabdominal technique was performed for global imaging of the pelvis including uterus, ovaries, adnexal regions, and pelvic cul-de-sac. It was necessary to proceed with endovaginal exam following the transabdominal exam to visualize the uterus, endometrium, and ovaries. Color and duplex Doppler ultrasound was utilized to evaluate blood flow to the ovaries. COMPARISON:  Ob ultrasound of July 16, 2014 FINDINGS: Uterus Measurements: 6.4 x 3.8 x 5.2 cm. No fibroids or other mass visualized. Endometrium Thickness: 3.3 mm.  No focal abnormality visualized. Right ovary Measurements: 4.0 x 1.9 x 1.9 cm. Normal appearance/no adnexal mass. Left ovary Measurements: 4.6 x 2.2 x 2.2 cm. There is a complex cyst measuring 1.7 x 1.4 x 1.6 cm. Pulsed Doppler evaluation of both ovaries demonstrates normal low-resistance arterial and venous waveforms. Other findings There is a small amount of free pelvic fluid. IMPRESSION: Complex cyst in the left ovary most compatible with a hemorrhagic cyst. There is no evidence of ovarian torsion. Correlation with the patient's beta HCG would be useful. Follow-up ultrasound in 08-12 weeks is recommended to re- evaluate the left ovary. Normal appearance of the uterus, endometrium, and right ovary. Small amount of free pelvic fluid. Electronically  Signed   By: David  [place] M.D.   On: 07/23/2016 08:04   US Pelvis Complete  Result Date: 07/23/2016 CLINICAL DATA:  Acute midline pelvic pain. Onset of last normal menstrual period was July 23, 2016. EXAM: TRANSABDOMINAL AND TRANSVAGINAL ULTRASOUND OF PELVIS DOPPLER ULTRASOUND OF OVARIES TECHNIQUE: Both transabdominal and transvaginal ultrasound examinations of the pelvis were performed. Transabdominal technique was performed for global imaging of the pelvis including uterus, ovaries, adnexal regions, and pelvic cul-de-sac. It was necessary to proceed with endovaginal exam following the transabdominal exam to visualize the uterus, endometrium, and ovaries. Color and duplex Doppler ultrasound was utilized to evaluate blood flow to the ovaries. COMPARISON:  Ob ultrasound of July 16, 2014 FINDINGS: Uterus Measurements: 6.4 x 3.8 x 5.2 cm. No fibroids or other mass visualized. Endometrium Thickness: 3.3 mm.  No focal abnormality visualized. Right ovary Measurements: 4.0 x 1.9 x 1.9 cm. Normal appearance/no adnexal mass.  Left ovary Measurements: 4.6 x 2.2 x 2.2 cm. There is a complex cyst measuring 1.7 x 1.4 x 1.6 cm. Pulsed Doppler evaluation of both ovaries demonstrates normal low-resistance arterial and venous waveforms. Other findings There is a small amount of free pelvic fluid. IMPRESSION: Complex cyst in the left ovary most compatible with a hemorrhagic cyst. There is no evidence of ovarian torsion. Correlation with the patient's beta HCG would be useful. Follow-up ultrasound in 08-12 weeks is recommended to re- evaluate the left ovary. Normal appearance of the uterus, endometrium, and right ovary. Small amount of free pelvic fluid. Electronically Signed   By: David  [place] M.D.   On: 07/23/2016 08:04   [place] Art/ven Flow Abd Pelv Doppler  Result Date: 07/23/2016 CLINICAL DATA:  Acute midline pelvic pain. Onset of last normal menstrual period was July 23, 2016. EXAM: TRANSABDOMINAL AND TRANSVAGINAL ULTRASOUND  OF PELVIS DOPPLER ULTRASOUND OF OVARIES TECHNIQUE: Both transabdominal and transvaginal ultrasound examinations of the pelvis were performed. Transabdominal technique was performed for global imaging of the pelvis including uterus, ovaries, adnexal regions, and pelvic cul-de-sac. It was necessary to proceed with endovaginal exam following the transabdominal exam to visualize the uterus, endometrium, and ovaries. Color and duplex Doppler ultrasound was utilized to evaluate blood flow to the ovaries. COMPARISON:  Ob ultrasound of July 16, 2014 FINDINGS: Uterus Measurements: 6.4 x 3.8 x 5.2 cm. No fibroids or other mass visualized. Endometrium Thickness: 3.3 mm.  No focal abnormality visualized. Right ovary Measurements: 4.0 x 1.9 x 1.9 cm. Normal appearance/no adnexal mass. Left ovary Measurements: 4.6 x 2.2 x 2.2 cm. There is a complex cyst measuring 1.7 x 1.4 x 1.6 cm. Pulsed Doppler evaluation of both ovaries demonstrates normal low-resistance arterial and venous waveforms. Other findings There is a small amount of free pelvic fluid. IMPRESSION: Complex cyst in the left ovary most compatible with a hemorrhagic cyst. There is no evidence of ovarian torsion. Correlation with the patient's beta HCG would be useful. Follow-up ultrasound in 08-12 weeks is recommended to re- evaluate the left ovary. Normal appearance of the uterus, endometrium, and right ovary. Small amount of free pelvic fluid. Electronically Signed   By: David  [place] M.D.   On: 07/23/2016 08:04    Procedures Procedures (including critical care time)  Medications Ordered in ED Medications  ibuprofen (ADVIL,MOTRIN) tablet 800 mg (800 mg Oral Given 07/23/16 1610)     Initial Impression / Assessment and Plan / ED Course  I have reviewed the triage vital signs and the nursing notes.  Pertinent labs & imaging results that were available during my care of the patient were reviewed by me and considered in my medical decision making (see chart  for details).     Final Clinical Impressions(s) / ED Diagnoses   Final diagnoses:  Abdominal pain  Menstrual cramps    Labs: Urinalysis, i-STAT beta-hCG, lipase, CMP, CBC  Imaging: Ultrasound pelvis complete  Consults:  Therapeutics: Ibuprofen  Discharge Meds:   Assessment/Plan: 21 year old female presents today with complaints of lower pelvic pain.  Patient is on her menstrual cycle, I have high suspicion for menstrual cramps in this patient.  Patient notes this is more severe than normal and slightly lower.  She has no infectious etiology, she will have a ultrasound performed to rule out torsion.  Patient given ibuprofen and will be reassessed.   Ultrasound shows complex cyst in left ovary most compatible with hemorrhagic cyst.  No other acute findings.  Patient has reassuring laboratory analysis with no  infectious findings, she is afebrile nontoxic with reassuring vital signs.  I have very low suspicion for any significant intra-abdominal or pelvic pathology in this otherwise healthy appearing patient.  Most likely related to menstrual cycle.  I explained results of ultrasound to patient, she will rest, use Tylenol or ibuprofen as needed for discomfort and follow-up with OB/GYN for repeat analysis.  She is instructed to return immediately if any new or worsening signs or symptoms present.  Patient verbalized understanding and agreement to today's plan had no further questions or concerns at the time discharge   New Prescriptions New Prescriptions   No medications on file     Eyvonne Mechanic, PA-C 07/23/16 1610    Zadie Rhine, MD 07/24/16 [phone]

## 2016-07-23 NOTE — ED Notes (Signed)
US in progress in room.

## 2016-07-23 NOTE — ED Notes (Signed)
Patient transported to Ultrasound 

## 2016-07-23 NOTE — ED Notes (Addendum)
Pt unable to provide urine specimen due to menses.

## 2016-07-23 NOTE — ED Notes (Signed)
Pt reports low abd pain that started last night at 2200,called out sick from work, pain became severe at 0100 with n/v.  Pt eating pretzels and drinking without difficulty and is keeping it down.

## 2016-07-23 NOTE — Discharge Instructions (Signed)
Please read the attached information.  Please return immediately if any new or worsening signs or symptoms present.  Please use Tylenol or ibuprofen as needed for discomfort.  Please follow-up with OB/GYN or primary care for repeat evaluation, please inform them of today's findings including likely hemorrhagic cyst on ultrasound.

## 2016-07-23 NOTE — ED Notes (Signed)
Pt c/o 10/10 lower abdominal pain onset 21:00 yesterday. N/V x3.

## 2018-01-21 ENCOUNTER — Other Ambulatory Visit: Payer: Self-pay

## 2018-01-21 ENCOUNTER — Emergency Department (HOSPITAL_COMMUNITY)
Admission: EM | Admit: 2018-01-21 | Discharge: 2018-01-21 | Disposition: A | Discharge status: Home / Self Care | Payer: Self-pay | Attending: Emergency Medicine | Admitting: Emergency Medicine

## 2018-01-21 ENCOUNTER — Encounter (HOSPITAL_COMMUNITY): Payer: Self-pay | Admitting: *Deleted

## 2018-01-21 DIAGNOSIS — H9201 Otalgia, right ear: Secondary | ICD-10-CM | POA: Insufficient documentation

## 2018-01-21 DIAGNOSIS — R202 Paresthesia of skin: Secondary | ICD-10-CM | POA: Insufficient documentation

## 2018-01-21 DIAGNOSIS — J45909 Unspecified asthma, uncomplicated: Secondary | ICD-10-CM | POA: Insufficient documentation

## 2018-01-21 DIAGNOSIS — Z87891 Personal history of nicotine dependence: Secondary | ICD-10-CM | POA: Insufficient documentation

## 2018-01-21 NOTE — Discharge Instructions (Signed)
You were seen in the ER for sensation of something crawling in right ear.   There was a small amount of fluid behind the right ear drum.  This could explain the beeping in your ear and discomfort.  It is unclear what is causing the sensation of crawling.  No signs of infection or ear drum perforation.   Try debrox solution for the next 3-5 days.  Additionally, take a daily allergy medications such as zyrtec or allegra to help get rid of fluid in the ear.   Follow up with ear nose and throat doctor in 1 week if symptoms persist  Return to ER for fever greater than 100.92F, ear redness warmth swelling, dizziness, headache, ear drainage or bleeding

## 2018-01-21 NOTE — ED Triage Notes (Signed)
Pt reports ear problem that started on SAt. Pt reports a pinging in Rt ear . Pt reports she hears a noise in Rt ear.Marland Kitchen

## 2018-01-21 NOTE — ED Notes (Signed)
Patient verbalizes understanding of discharge instructions. Opportunity for questioning and answers were provided. Armband removed by staff, pt discharged from ED home via POV.  

## 2018-01-21 NOTE — ED Provider Notes (Signed)
[facility] EMERGENCY DEPARTMENT Provider Note   CSN: [id number] Arrival date & time: 01/21/18  1800     History   Chief Complaint Chief Complaint  Patient presents with  . Otalgia    HPI Jennifer Bird is a 22 y.o. female with h/o asthma here for evaluation of right ear discomfort.  Described as "something crawling" inside her right ear to her left ear, intermittent.  She is very distressed about this, states she has been crying and now having nasal congestion and rhinorrhea.  Crawling sensation is waking her up at night.  Onset Saturday.  States initially she heard a high pitched beeping in her right ear x 3.  Her friend yelled in her right ear and the beeping stopped.  Now she is experiencing a hot sensation inside her ears and crawling sensation like a bug moving around.  Feels like something is trying to come out.  Was upset and started crying so now states she has nasal congestion.  Has tried to clean it out with q-tip and hair shampoo.    No fevers, chills, headaches, dizziness, ear drainage or bleeding. No recent swimming.  No alleviating factors. No h/o surgeries or frequent infections in the ear.  HPI  Past Medical History:  Diagnosis Date  . Asthma   . Bronchitis     Patient Active Problem List   Diagnosis Date Noted  . Supervision of normal pregnancy, antepartum 04/18/2016    History reviewed. No pertinent surgical history.   OB History    Gravida  2   Para      Term      Preterm      AB      Living        SAB      TAB      Ectopic      Multiple      Live Births               Home Medications    Prior to Admission medications   Medication Sig Start Date End Date Taking? Authorizing Provider  albuterol (PROVENTIL HFA;VENTOLIN HFA) 108 (90 BASE) MCG/ACT inhaler Inhale 2 puffs into the lungs every 4 (four) hours as needed for wheezing or shortness of breath.    [provider]  diphenhydrAMINE (BENADRYL) 25 mg  capsule Take 1 capsule (25 mg total) by mouth every 6 (six) hours as needed. Patient not taking: Reported on 07/23/2016 06/21/16   Barrett Henle, PA-C  hydrOXYzine (ATARAX/VISTARIL) 10 MG tablet Take 1 tablet (10 mg total) by mouth every 6 (six) hours as needed for itching. Patient not taking: Reported on 07/23/2016 06/21/16   Barrett Henle, PA-C  predniSONE (DELTASONE) 20 MG tablet Take 3 tablets (60 mg total) by mouth daily. Patient not taking: Reported on 07/23/2016 06/23/16   Elpidio Anis, PA-C    Family History History reviewed. No pertinent family history.  Social History Social History   Tobacco Use  . Smoking status: Former Smoker    Types: Cigarettes  . Smokeless tobacco: Never Used  Substance Use Topics  . Alcohol use: No  . Drug use: No     Allergies   Codeine and Other   Review of Systems Review of Systems  HENT: Positive for congestion and tinnitus.        Sensation of crawling in ear   All other systems reviewed and are negative.    Physical Exam Updated Vital Signs BP 101/75 (BP Location:  Right Arm)   Pulse 85   Temp 98.9 F (37.2 C) (Oral)   Resp 16   Ht 5\' 10"  (1.778 m)   Wt 79.8 kg   LMP 01/21/2018   SpO2 98%   BMI 25.25 kg/m   Physical Exam  Constitutional: She is oriented to person, place, and time. She appears well-developed and well-nourished.  Non-toxic appearance.  HENT:  Head: Normocephalic.  Right Ear: External ear normal. A middle ear effusion is present.  Left Ear: External ear normal.  Nose: Mucosal edema and rhinorrhea present.  Ears: R TMs is cloudy, unable to visualize bony landmarks, however no edema, erythema, perforation.  Scant amount of dark ear wax in ear canal.  L TM is normal.  External ears w/o lesions, swelling, deformities or tenderness in mastoids. R and L external ear auditory canals clear without edema or erythema. No pain reported with external ear manipulation.   Nose: Moderate nasal mucosa edema  with clear rhinorrhea. No sinus tenderness. Septum midline.   Throat: Oropharynx and tonsils moist without erythema, edema or exudates. Uvula midline. No trismus.   Eyes: Conjunctivae and EOM are normal.  Neck: Full passive range of motion without pain.  Cardiovascular: Normal rate.  Pulmonary/Chest: Effort normal. No tachypnea. No respiratory distress.  Musculoskeletal: Normal range of motion.  Neurological: She is alert and oriented to person, place, and time.  Skin: Skin is warm and dry. Capillary refill takes less than 2 seconds.  Psychiatric: Her behavior is normal. Thought content normal.     ED Treatments / Results  Labs (all labs ordered are listed, but only abnormal results are displayed) Labs Reviewed - No data to display  EKG None  Radiology No results found.  Procedures Procedures (including critical care time)  Medications Ordered in ED Medications - No data to display   Initial Impression / Assessment and Plan / ED Course  I have reviewed the triage vital signs and the nursing notes.  Pertinent labs & imaging results that were available during my care of the patient were reviewed by me and considered in my medical decision making (see chart for details).    22 year old here with transient, resolved tinnitus of the right ear followed by vague discomfort and sensation of "something crawling" inside her right ear.  Exam today as above is reassuring, no signs of otitis media, otitis externa, mastoiditis, perforation, foreign bodies inside the ear canal.  Right tympanic membrane is cloudy suggesting some middle ear effusion which could explain discomfort and tinnitus.  She also has moderate nasal congestion and rhinorrhea which fits the clinical picture.  I recommended patient try Flonase, antihistamine for the next 5 to 7 days.  Recommended follow-up with ENT/PCP for reevaluation if symptoms do not improve or resolve.  Discussed return precautions.  Patient is in  agreement.  Final Clinical Impressions(s) / ED Diagnoses   Final diagnoses:  Sensation of skin crawling  Discomfort of right ear    ED Discharge Orders    None       Liberty Handy, PA-C 01/21/18 2240    Benjiman Core, MD 01/22/18 0000

## 2018-01-22 ENCOUNTER — Encounter (HOSPITAL_COMMUNITY): Payer: Self-pay | Admitting: Emergency Medicine

## 2018-01-22 ENCOUNTER — Emergency Department (HOSPITAL_COMMUNITY): Payer: Self-pay

## 2018-01-22 ENCOUNTER — Emergency Department (HOSPITAL_COMMUNITY)
Admission: EM | Admit: 2018-01-22 | Discharge: 2018-01-22 | Disposition: A | Discharge status: Home / Self Care | Payer: Self-pay | Attending: Emergency Medicine | Admitting: Emergency Medicine

## 2018-01-22 ENCOUNTER — Encounter (HOSPITAL_COMMUNITY): Payer: Self-pay | Admitting: *Deleted

## 2018-01-22 ENCOUNTER — Encounter (HOSPITAL_COMMUNITY): Payer: Self-pay

## 2018-01-22 ENCOUNTER — Other Ambulatory Visit: Payer: Self-pay

## 2018-01-22 DIAGNOSIS — Z87891 Personal history of nicotine dependence: Secondary | ICD-10-CM | POA: Insufficient documentation

## 2018-01-22 DIAGNOSIS — J45909 Unspecified asthma, uncomplicated: Secondary | ICD-10-CM | POA: Insufficient documentation

## 2018-01-22 DIAGNOSIS — H61891 Other specified disorders of right external ear: Secondary | ICD-10-CM | POA: Insufficient documentation

## 2018-01-22 DIAGNOSIS — H9391 Unspecified disorder of right ear: Secondary | ICD-10-CM | POA: Insufficient documentation

## 2018-01-22 DIAGNOSIS — H9203 Otalgia, bilateral: Secondary | ICD-10-CM | POA: Insufficient documentation

## 2018-01-22 DIAGNOSIS — H9201 Otalgia, right ear: Secondary | ICD-10-CM | POA: Insufficient documentation

## 2018-01-22 MED ORDER — HYDROXYZINE HCL 25 MG PO TABS
25.0000 mg | ORAL_TABLET | Freq: Once | ORAL | Status: AC
  Administered 2018-01-22: 25 mg via ORAL
  Filled 2018-01-22: qty 1

## 2018-01-22 MED ORDER — DIAZEPAM 5 MG PO TABS
5.0000 mg | ORAL_TABLET | Freq: Once | ORAL | Status: AC
  Administered 2018-01-22: 5 mg via ORAL
  Filled 2018-01-22: qty 1

## 2018-01-22 NOTE — ED Notes (Addendum)
Pt's friend came up to this RN to inform that pt smokes marijuana often and is concerned that marijuana was laced with "angel dust" or something.

## 2018-01-22 NOTE — ED Triage Notes (Signed)
Pt in via EMS to triage c/o continued pain to her right ear, pt thinks there is something in her ear, tearful in triage, seen for same x2 this week

## 2018-01-22 NOTE — ED Notes (Signed)
States she feels like something is crawling in her right ear.

## 2018-01-22 NOTE — Discharge Instructions (Addendum)
Please call an ENT for follow up  You will benefit from an evaluation by an AUDIOLOGIST

## 2018-01-22 NOTE — Discharge Instructions (Signed)
No foreign body was seen on exam. There is no sign of infection.  Please follow-up with ENT.

## 2018-01-22 NOTE — Discharge Instructions (Signed)
The goal of emergency department care is to treat life, limb, or organ threatening illness. We are not always able to make a diagnosis in the emergency department therefore it is important that you follow up with a primary care doctor or a specialist who is able to help you.  Return to the Emergency Department for the following reasons: You have symptoms of meningitis: Severe headache, Nausea, vomiting, sensitivity to light and sound, inability to move your neck, rash and high fever. You have trouble swallowing and change in voice. You have redness and bulging of the bony process behind your ear. You have fluid or blood coming from your ear. You have room spinning dizziness.  See a primary care doctor or the Ear, Nose and Throat doctor for the following reasons: Your pain does not improve within 2 days. Your earache gets worse. You have new symptoms. You have a fever.

## 2018-01-22 NOTE — ED Notes (Signed)
Discharge instructions given patient very upset wants the ENT to come to the ED and see her, I explained that we were giving her a referral to ENT however that wasn't good enough. " I will sue this hospital  If something is in my ear. ".

## 2018-01-22 NOTE — ED Notes (Signed)
MD went into room to talk to pt.

## 2018-01-22 NOTE — ED Provider Notes (Signed)
[facility] EMERGENCY DEPARTMENT Provider Note   CSN: [id number] Arrival date & time: 01/22/18  4098     History   Chief Complaint Chief Complaint  Patient presents with  . Otalgia    HPI Jennifer Bird is a 22 y.o. female who presents with cc of foreign body sensation in her R ear. She states that it she feels like a parasite is crawling under the skin in her ear.   He feels like there is beeping and bubbling in her ear and also feels like when she turns her head to the left liquid drains through to her left ear.  She is obviously very upset and agitated during the interview.  She does not understand why no one can tell her what is wrong with her ear.  She has been here twice already for the same complaint yesterday at 6 PM, and this morning at 12:30 in the morning.  She denies any recent foreign travel.  She denies hearing loss.  She denies neck stiffness, fever, rash, severe headache.  She says that she took a Percocet last night and it still did not help her symptoms.  She is not prescribed Percocet.  She denies hearing loss, vertigo or tinnitus.  HPI  Past Medical History:  Diagnosis Date  . Asthma   . Bronchitis     Patient Active Problem List   Diagnosis Date Noted  . Supervision of normal pregnancy, antepartum 04/18/2016    History reviewed. No pertinent surgical history.   OB History    Gravida  2   Para      Term      Preterm      AB      Living        SAB      TAB      Ectopic      Multiple      Live Births               Home Medications    Prior to Admission medications   Medication Sig Start Date End Date Taking? Authorizing Provider  albuterol (PROVENTIL HFA;VENTOLIN HFA) 108 (90 BASE) MCG/ACT inhaler Inhale 2 puffs into the lungs every 4 (four) hours as needed for wheezing or shortness of breath.    [provider]  diphenhydrAMINE (BENADRYL) 25 mg capsule Take 1 capsule (25 mg total) by mouth every 6  (six) hours as needed. Patient not taking: Reported on 07/23/2016 06/21/16   Barrett Henle, PA-C  hydrOXYzine (ATARAX/VISTARIL) 10 MG tablet Take 1 tablet (10 mg total) by mouth every 6 (six) hours as needed for itching. Patient not taking: Reported on 07/23/2016 06/21/16   Barrett Henle, PA-C  predniSONE (DELTASONE) 20 MG tablet Take 3 tablets (60 mg total) by mouth daily. Patient not taking: Reported on 07/23/2016 06/23/16   Elpidio Anis, PA-C    Family History History reviewed. No pertinent family history.  Social History Social History   Tobacco Use  . Smoking status: Former Smoker    Types: Cigarettes  . Smokeless tobacco: Never Used  Substance Use Topics  . Alcohol use: No  . Drug use: No     Allergies   Codeine and Other   Review of Systems Review of Systems Ten systems reviewed and are negative for acute change, except as noted in the HPI.    Physical Exam Updated Vital Signs BP (!) 118/94 (BP Location: Left Arm)   Pulse (!) 122   Temp  98.9 F (37.2 C) (Oral)   Resp 20   LMP 01/21/2018   SpO2 99%   Physical Exam  Constitutional: She is oriented to person, place, and time. She appears well-developed and well-nourished. No distress.  HENT:  Head: Normocephalic and atraumatic.  Right Ear: Hearing, tympanic membrane, external ear and ear canal normal. No drainage, swelling or tenderness. No mastoid tenderness.  Left Ear: Hearing, tympanic membrane, external ear and ear canal normal. No drainage, swelling or tenderness. No mastoid tenderness.  Eyes: Pupils are equal, round, and reactive to light. Conjunctivae and EOM are normal. No scleral icterus.  Neck: Normal range of motion.  Cardiovascular: Normal rate, regular rhythm and normal heart sounds. Exam reveals no gallop and no friction rub.  No murmur heard. Pulmonary/Chest: Effort normal and breath sounds normal. No respiratory distress.  Abdominal: Soft. Bowel sounds are normal. She exhibits no  distension and no mass. There is no tenderness. There is no guarding.  Neurological: She is alert and oriented to person, place, and time.  Skin: Skin is warm and dry. She is not diaphoretic.  Psychiatric: Her behavior is normal.  Nursing note and vitals reviewed.    ED Treatments / Results  Labs (all labs ordered are listed, but only abnormal results are displayed) Labs Reviewed - No data to display  EKG None  Radiology No results found.  Procedures Procedures (including critical care time)  Medications Ordered in ED Medications - No data to display   Initial Impression / Assessment and Plan / ED Course  I have reviewed the triage vital signs and the nursing notes.  Pertinent labs & imaging results that were available during my care of the patient were reviewed by me and considered in my medical decision making (see chart for details).     Patient has a normal examination.  She has no mastoid tenderness, no pain with movement of the pinna, no swelling, no erythema, no foreign body, no effusion behind the eardrum.  She has no vertigo, no nystagmus.  She has full range of motion of her neck.  There is no evidence of mastoiditis there is no evidence of meningitis.  She has no evidence of acute otitis media or otitis externa.  The patient is flaking her ear repeatedly.  She seems to be extremely fixated on a diagnosis of a parasite in her ear.  She did not became extremely agitated with me because I could not tell her why it was a problem and stated that she needed an x-ray of her ear.  Do not feel that the patient needs any imaging at this time.  I have spoken with the patient about the goals of care in the emergency department including evaluation and rule out along with stabilization of life limb and organ threatening illness and that she should call the ear nose and throat doctor this morning for follow-up with a specialist.  Was not satisfied with this.  I was unable to assuage the  patient's concerns.  The patient is referred to ENT.  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  Final Clinical Impressions(s) / ED Diagnoses   Final diagnoses:  None    ED Discharge Orders    None       Arthor Captain, PA-C 01/22/18 0844    Charlynne Pander, MD 01/22/18 [phone]

## 2018-01-22 NOTE — ED Triage Notes (Signed)
Pt states she feels as if something is in her right ear. Pt states she felt this way earlier tonight. Nothing visualized in ear. Pt keeps asking her friend to yell in her ear.

## 2018-01-22 NOTE — ED Triage Notes (Signed)
Per EMS: Pt from home.  Was seen earlier today at [facility] for ear pain, stating something is crawling in her ear, from one ear to another.

## 2018-01-22 NOTE — ED Notes (Signed)
Bed: WA06 Expected date:  Expected time:  Means of arrival:  Comments: EMS/tactile hallucinations

## 2018-01-22 NOTE — ED Notes (Signed)
Pt agitated and was not able to obtain updated vital signs.

## 2018-01-22 NOTE — ED Provider Notes (Signed)
[facility]-EMERGENCY DEPT Provider Note   CSN: [id number] Arrival date & time: 01/22/18  1050     History   Chief Complaint Chief Complaint  Patient presents with  . Otalgia    HPI Maghan Tessier is a 22 y.o. female.  HPI Patient is a 22 year old female presents the emergency department with complaints of abnormal sensation behind both of her eardrums.  She feels like there is something crawling in there.  When I walked into the room she is slapping both of her ears.  This is now her fourth visit to the emergency department in the past 24 hours for similar symptoms.  No pathology has been seen thus far by the providers.  No injury or trauma.  No other complaints.  Symptoms are moderate to severe.   Past Medical History:  Diagnosis Date  . Asthma   . Bronchitis     Patient Active Problem List   Diagnosis Date Noted  . Supervision of normal pregnancy, antepartum 04/18/2016    History reviewed. No pertinent surgical history.   OB History    Gravida  2   Para      Term      Preterm      AB      Living        SAB      TAB      Ectopic      Multiple      Live Births               Home Medications    Prior to Admission medications   Medication Sig Start Date End Date Taking? Authorizing Provider  diphenhydrAMINE (BENADRYL) 25 mg capsule Take 1 capsule (25 mg total) by mouth every 6 (six) hours as needed. Patient not taking: Reported on 07/23/2016 06/21/16   Barrett Henle, PA-C  hydrOXYzine (ATARAX/VISTARIL) 10 MG tablet Take 1 tablet (10 mg total) by mouth every 6 (six) hours as needed for itching. Patient not taking: Reported on 07/23/2016 06/21/16   Barrett Henle, PA-C  predniSONE (DELTASONE) 20 MG tablet Take 3 tablets (60 mg total) by mouth daily. Patient not taking: Reported on 07/23/2016 06/23/16   Elpidio Anis, PA-C    Family History History reviewed. No pertinent family history.  Social History Social  History   Tobacco Use  . Smoking status: Former Smoker    Types: Cigarettes  . Smokeless tobacco: Never Used  Substance Use Topics  . Alcohol use: No  . Drug use: No     Allergies   Codeine; Other; and Tomato   Review of Systems Review of Systems  All other systems reviewed and are negative.    Physical Exam Updated Vital Signs BP 122/75   Pulse (!) 120   Temp 99.2 F (37.3 C) (Oral)   Resp 16   LMP 01/20/2018   SpO2 98%   Physical Exam  Constitutional: She is oriented to person, place, and time. She appears well-developed and well-nourished.  HENT:  Head: Normocephalic.  Bilateral external ears are normal.  Bilateral external auditory canals are normal.  Bilateral TMs are normal.  No foreign body seen in the external auditory canal.  Eyes: EOM are normal.  Neck: Normal range of motion.  Pulmonary/Chest: Effort normal.  Abdominal: She exhibits no distension.  Musculoskeletal: Normal range of motion.  Neurological: She is alert and oriented to person, place, and time.  Psychiatric: She has a normal mood and affect.  Nursing note and vitals  reviewed.    ED Treatments / Results  Labs (all labs ordered are listed, but only abnormal results are displayed) Labs Reviewed - No data to display  EKG None  Radiology Ct Temporal Bones Wo Contrast  Result Date: 01/22/2018 CLINICAL DATA:  23 year old female with right ear pain and sensation of right ear foreign body, but normal physical exam appearance of both external auditory canals. Noises/ringing in right ear. EXAM: CT TEMPORAL BONES WITHOUT CONTRAST TECHNIQUE: Axial and coronal plane CT imaging of the petrous temporal bones was performed with thin-collimation image reconstruction. No intravenous contrast was administered. Multiplanar CT image reconstructions were also generated. COMPARISON:  Head CT without contrast 10/13/2015. FINDINGS: Stable since 2017 and negative visible noncontrast brain parenchyma. Visible  paranasal sinuses are stable in clear. Skull base bone mineralization is stable and within normal limits. Negative visible noncontrast deep soft tissue spaces of the face. LEFT TEMPORAL BONE: The left EAC is clear. The majority of the left tympanic membrane is normal although there is a small 2-3 millimeter triangular area of nonspecific soft tissue thickening along the posterior inferior attachment of the membrane (series 9, image 60 and series 8, image 30. Deep to the left TM the left tympanic cavity is clear and normal. The left ossicles are intact and normally aligned. The scutum is intact. The left mastoid antrum and mastoid air cells are clear. Left IAC, cochlea, vestibule and vestibular aqueduct are within normal limits. Normal semicircular canals. Normal course of the left 7th nerve. RIGHT TEMPORAL BONE: The right external auditory canal is clear. The right tympanic membrane is diminutive and normal with no soft tissue thickening (series 7, image 61 on the right versus series 9, image 60 on the left). The right tympanic cavity is clear and normal. The ossicles appear intact and normally aligned. The right scutum is intact. The right mastoid antrum and air cells are clear. The right IAC, cochlea, vestibule and vestibular aqueduct are normal. Normal right semicircular canals. Normal course of the right 7th nerve. IMPRESSION: 1. Normal right temporal bone. Normal CT appearance of the right middle ear with no foreign body or abnormality identified. 2. On the left there is mild soft tissue thickening at the posterior inferior attachment of the left tympanic membrane, nonspecific. See series 8 image 30. Otherwise normal left temporal bone. Electronically Signed   By: Odessa Fleming M.D.   On: 01/22/2018 12:29    Procedures Procedures (including critical care time)  Medications Ordered in ED Medications  diazepam (VALIUM) tablet 5 mg (5 mg Oral Given 01/22/18 1115)     Initial Impression / Assessment and Plan /  ED Course  I have reviewed the triage vital signs and the nursing notes.  Pertinent labs & imaging results that were available during my care of the patient were reviewed by me and considered in my medical decision making (see chart for details).    Direct examination the bedside demonstrates no foreign bodies in either external auditory canal.  Given her fourth visit to the emergency department CT temporal bone study was performed which demonstrates no significant abnormality.  She will need outpatient ENT follow-up for further evaluation.  Medical screen examination complete.  No indication for additional work-up emergently in the ER.  Patient states this is affecting her sleep and therefore I recommended 25 to 50 mg oral Benadryl prior to going to sleep to help with rest.  Final Clinical Impressions(s) / ED Diagnoses   Final diagnoses:  Acute otalgia, bilateral  ED Discharge Orders    None       Azalia Bilis, MD 01/22/18 1427

## 2018-01-22 NOTE — ED Provider Notes (Signed)
[facility]-EMERGENCY DEPT Provider Note   CSN: [id number] Arrival date & time: 01/22/18  0032     History   Chief Complaint Chief Complaint  Patient presents with  . Otalgia    HPI Jennifer Bird is a 22 y.o. female.  Patient presents to the emergency department with a chief complaint of foreign body sensation right ear.  She was seen a couple of hours ago at [facility] for the same.  Reports that it feels like her skin is crawling.  Has not tried anything for symptoms.  She denies any fevers or chills.  Denies any recent illness.  There are no other associated symptoms.  The history is provided by the patient. No language interpreter was used.    Past Medical History:  Diagnosis Date  . Asthma   . Bronchitis     Patient Active Problem List   Diagnosis Date Noted  . Supervision of normal pregnancy, antepartum 04/18/2016    History reviewed. No pertinent surgical history.   OB History    Gravida  2   Para      Term      Preterm      AB      Living        SAB      TAB      Ectopic      Multiple      Live Births               Home Medications    Prior to Admission medications   Medication Sig Start Date End Date Taking? Authorizing Provider  albuterol (PROVENTIL HFA;VENTOLIN HFA) 108 (90 BASE) MCG/ACT inhaler Inhale 2 puffs into the lungs every 4 (four) hours as needed for wheezing or shortness of breath.    [provider]  diphenhydrAMINE (BENADRYL) 25 mg capsule Take 1 capsule (25 mg total) by mouth every 6 (six) hours as needed. Patient not taking: Reported on 07/23/2016 06/21/16   Barrett Henle, PA-C  hydrOXYzine (ATARAX/VISTARIL) 10 MG tablet Take 1 tablet (10 mg total) by mouth every 6 (six) hours as needed for itching. Patient not taking: Reported on 07/23/2016 06/21/16   Barrett Henle, PA-C  predniSONE (DELTASONE) 20 MG tablet Take 3 tablets (60 mg total) by mouth daily. Patient not taking:  Reported on 07/23/2016 06/23/16   Elpidio Anis, PA-C    Family History No family history on file.  Social History Social History   Tobacco Use  . Smoking status: Former Smoker    Types: Cigarettes  . Smokeless tobacco: Never Used  Substance Use Topics  . Alcohol use: No  . Drug use: No     Allergies   Codeine and Other   Review of Systems Review of Systems  All other systems reviewed and are negative.    Physical Exam Updated Vital Signs BP 127/82 (BP Location: Left Arm)   Pulse 85   Temp 99.2 F (37.3 C) (Oral)   Resp (!) 21   LMP 01/21/2018   SpO2 93%   Physical Exam  Constitutional: She is oriented to person, place, and time. No distress.  HENT:  Head: Normocephalic and atraumatic.  No foreign body, normal external ear exam, hearing grossly intact  Eyes: Pupils are equal, round, and reactive to light. Conjunctivae and EOM are normal.  Neck: No tracheal deviation present.  Cardiovascular: Normal rate.  Pulmonary/Chest: Effort normal. No respiratory distress.  Abdominal: Soft.  Musculoskeletal: Normal range of motion.  Neurological:  She is alert and oriented to person, place, and time.  Skin: Skin is warm and dry. She is not diaphoretic.  Psychiatric: Judgment normal.  Nursing note and vitals reviewed.    ED Treatments / Results  Labs (all labs ordered are listed, but only abnormal results are displayed) Labs Reviewed - No data to display  EKG None  Radiology No results found.  Procedures Procedures (including critical care time)  Medications Ordered in ED Medications  hydrOXYzine (ATARAX/VISTARIL) tablet 25 mg (has no administration in time range)     Initial Impression / Assessment and Plan / ED Course  I have reviewed the triage vital signs and the nursing notes.  Pertinent labs & imaging results that were available during my care of the patient were reviewed by me and considered in my medical decision making (see chart for  details).     Foreign body sensation in right ear.  There is no foreign body.  There is no sign of infection.  Patient is very anxious.  Smells strongly of marijuana.  Question psychosomatic versus drug reaction.  No further emergent work-up indicated.  Final Clinical Impressions(s) / ED Diagnoses   Final diagnoses:  Foreign body sensation in right ear canal    ED Discharge Orders    None       Roxy Horseman, PA-C 01/22/18 0110    Shon Baton, MD 01/22/18 [phone]

## 2018-01-22 NOTE — ED Notes (Signed)
D/C attempted attempted at this time.  Pt refused to leave ED without talking to Dr.

## 2018-01-23 ENCOUNTER — Other Ambulatory Visit: Payer: Self-pay

## 2018-01-23 ENCOUNTER — Emergency Department (HOSPITAL_COMMUNITY)
Admission: EM | Admit: 2018-01-23 | Discharge: 2018-01-23 | Disposition: A | Discharge status: Home / Self Care | Payer: Self-pay | Attending: Emergency Medicine | Admitting: Emergency Medicine

## 2018-01-23 ENCOUNTER — Encounter (HOSPITAL_COMMUNITY): Payer: Self-pay | Admitting: Emergency Medicine

## 2018-01-23 DIAGNOSIS — H9203 Otalgia, bilateral: Secondary | ICD-10-CM | POA: Insufficient documentation

## 2018-01-23 DIAGNOSIS — H61893 Other specified disorders of external ear, bilateral: Secondary | ICD-10-CM

## 2018-01-23 DIAGNOSIS — Z87891 Personal history of nicotine dependence: Secondary | ICD-10-CM | POA: Insufficient documentation

## 2018-01-23 DIAGNOSIS — J45909 Unspecified asthma, uncomplicated: Secondary | ICD-10-CM | POA: Insufficient documentation

## 2018-01-23 NOTE — ED Notes (Signed)
Declined W/C at D/C and was escorted to lobby by RN. 

## 2018-01-23 NOTE — Discharge Instructions (Addendum)
Evaluated today for sensation in your ear discharge.  You did not have any evidence of infection, foreign body in your ear.  I believe the discharge from your right ear is from the eardrops you recently started yesterday.  These follow-up with ear nose and throat for reevaluation. Return to the emergency department any new or worsening symptoms such as:  Get help right away if: You have a severe headache. You have a stiff neck. You have trouble swallowing. You have redness or swelling behind your ear. You have fluid or blood coming from your ear. You have hearing loss. You feel dizzy.

## 2018-01-23 NOTE — ED Triage Notes (Signed)
Patient to ED c/o persistent R ear pain - seen for same yesterday. Has been trying OTC earache drops and reports green discharge. Denies fevers.

## 2018-01-23 NOTE — ED Provider Notes (Signed)
[facility] EMERGENCY DEPARTMENT Provider Note   CSN: [id number] Arrival date & time: 01/23/18  4098   History   Chief Complaint Chief Complaint  Patient presents with  . Otalgia    HPI Jennifer Bird is a 22 y.o. female past medical history significant for asthma and bronchitis who presents for evaluation of foreign body sensation in both ears.  She states she has been seen multiple times for this issue in the last 24 hours, no pathology has been seen by any providers thus far.  Feels like there is a "bug crawling around in my ears."  States she purchased over-the-counter eardrops yesterday and when she awoke this morning she had discharge from her right ear.  States she thinks the discharge was green in color, however states that she did not have the lights on in her room at the time.  Patient is slightly agitated during the visit, stating that no one is been able to tell her what is wrong.  Denies fever, chills, rash, headache, neck pain, neck stiffness, tinnitus, hearing loss, vertigo, nausea or vomiting.  Nuys any aggravating or alleviating factors.  Injury trauma to ear or head.  HPI  Past Medical History:  Diagnosis Date  . Asthma   . Bronchitis     Patient Active Problem List   Diagnosis Date Noted  . Supervision of normal pregnancy, antepartum 04/18/2016    History reviewed. No pertinent surgical history.   OB History    Gravida  2   Para      Term      Preterm      AB      Living        SAB      TAB      Ectopic      Multiple      Live Births               Home Medications    Prior to Admission medications   Medication Sig Start Date End Date Taking? Authorizing Provider  diphenhydrAMINE (BENADRYL) 25 mg capsule Take 1 capsule (25 mg total) by mouth every 6 (six) hours as needed. Patient not taking: Reported on 07/23/2016 06/21/16   Barrett Henle, PA-C  hydrOXYzine (ATARAX/VISTARIL) 10 MG tablet Take 1 tablet (10  mg total) by mouth every 6 (six) hours as needed for itching. Patient not taking: Reported on 07/23/2016 06/21/16   Barrett Henle, PA-C  predniSONE (DELTASONE) 20 MG tablet Take 3 tablets (60 mg total) by mouth daily. Patient not taking: Reported on 07/23/2016 06/23/16   Elpidio Anis, PA-C    Family History No family history on file.  Social History Social History   Tobacco Use  . Smoking status: Former Smoker    Types: Cigarettes  . Smokeless tobacco: Never Used  Substance Use Topics  . Alcohol use: No  . Drug use: No     Allergies   Codeine; Other; and Tomato   Review of Systems Review of Systems  Constitutional: Negative for activity change, appetite change, chills, diaphoresis, fatigue and fever.  HENT: Positive for ear discharge and ear pain. Negative for congestion, dental problem, drooling, facial swelling, hearing loss, nosebleeds, postnasal drip, rhinorrhea, sinus pressure, sinus pain, sneezing, tinnitus, trouble swallowing and voice change.   Respiratory: Negative for shortness of breath.   Cardiovascular: Negative for chest pain.  Gastrointestinal: Negative for nausea and vomiting.  Musculoskeletal: Negative for neck pain and neck stiffness.  Skin: Negative.  Neurological: Negative for dizziness, light-headedness, numbness and headaches.     Physical Exam Updated Vital Signs BP 135/78 (BP Location: Right Arm)   Pulse 81   Temp 98.9 F (37.2 C) (Oral)   Resp 18   LMP 01/21/2018   SpO2 99%   Physical Exam  Constitutional: She appears well-developed and well-nourished. No distress.  HENT:  Head: Normocephalic and atraumatic.  Right Ear: Hearing, tympanic membrane, external ear and ear canal normal. No lacerations. No drainage, swelling or tenderness. No foreign bodies. No mastoid tenderness. Tympanic membrane is not injected, not scarred, not perforated, not erythematous, not retracted and not bulging. Tympanic membrane mobility is normal. No middle  ear effusion. No decreased hearing is noted.  Left Ear: Hearing, tympanic membrane, external ear and ear canal normal. No lacerations. No drainage, swelling or tenderness. No foreign bodies. No mastoid tenderness. Tympanic membrane is not injected, not scarred, not perforated, not erythematous, not retracted and not bulging. Tympanic membrane mobility is normal.  No middle ear effusion. No decreased hearing is noted.  Nose: Nose normal. Right sinus exhibits no maxillary sinus tenderness and no frontal sinus tenderness. Left sinus exhibits no maxillary sinus tenderness and no frontal sinus tenderness.  Mouth/Throat: Uvula is midline, oropharynx is clear and moist and mucous membranes are normal.  Eyes: Pupils are equal, round, and reactive to light.  Neck: Normal range of motion and full passive range of motion without pain. No spinous process tenderness and no muscular tenderness present. No neck rigidity. Normal range of motion present.  Cardiovascular: Normal rate.  Pulmonary/Chest: No respiratory distress.  Abdominal: She exhibits no distension.  Musculoskeletal: Normal range of motion.  Neurological: She is alert.  Skin: Skin is warm and dry.  Psychiatric: She has a normal mood and affect. Judgment normal. Her affect is not angry, not blunt, not labile and not inappropriate. Her speech is not rapid and/or pressured and not slurred. She is not agitated, not aggressive, not hyperactive, not slowed, not withdrawn, not actively hallucinating and not combative. Thought content is not paranoid and not delusional. Cognition and memory are normal. She does not exhibit a depressed mood. She expresses no homicidal and no suicidal ideation. She expresses no suicidal plans and no homicidal plans.  Nursing note and vitals reviewed.    ED Treatments / Results  Labs (all labs ordered are listed, but only abnormal results are displayed) Labs Reviewed - No data to display  EKG None  Radiology Ct  Temporal Bones Wo Contrast  Result Date: 01/22/2018 CLINICAL DATA:  22 year old female with right ear pain and sensation of right ear foreign body, but normal physical exam appearance of both external auditory canals. Noises/ringing in right ear. EXAM: CT TEMPORAL BONES WITHOUT CONTRAST TECHNIQUE: Axial and coronal plane CT imaging of the petrous temporal bones was performed with thin-collimation image reconstruction. No intravenous contrast was administered. Multiplanar CT image reconstructions were also generated. COMPARISON:  Head CT without contrast 10/13/2015. FINDINGS: Stable since 2017 and negative visible noncontrast brain parenchyma. Visible paranasal sinuses are stable in clear. Skull base bone mineralization is stable and within normal limits. Negative visible noncontrast deep soft tissue spaces of the face. LEFT TEMPORAL BONE: The left EAC is clear. The majority of the left tympanic membrane is normal although there is a small 2-3 millimeter triangular area of nonspecific soft tissue thickening along the posterior inferior attachment of the membrane (series 9, image 60 and series 8, image 30. Deep to the left TM the left tympanic cavity is  clear and normal. The left ossicles are intact and normally aligned. The scutum is intact. The left mastoid antrum and mastoid air cells are clear. Left IAC, cochlea, vestibule and vestibular aqueduct are within normal limits. Normal semicircular canals. Normal course of the left 7th nerve. RIGHT TEMPORAL BONE: The right external auditory canal is clear. The right tympanic membrane is diminutive and normal with no soft tissue thickening (series 7, image 61 on the right versus series 9, image 60 on the left). The right tympanic cavity is clear and normal. The ossicles appear intact and normally aligned. The right scutum is intact. The right mastoid antrum and air cells are clear. The right IAC, cochlea, vestibule and vestibular aqueduct are normal. Normal right  semicircular canals. Normal course of the right 7th nerve. IMPRESSION: 1. Normal right temporal bone. Normal CT appearance of the right middle ear with no foreign body or abnormality identified. 2. On the left there is mild soft tissue thickening at the posterior inferior attachment of the left tympanic membrane, nonspecific. See series 8 image 30. Otherwise normal left temporal bone. Electronically Signed   By: Odessa Fleming M.D.   On: 01/22/2018 12:29    Procedures Procedures (including critical care time)  Medications Ordered in ED Medications - No data to display   Initial Impression / Assessment and Plan / ED Course  I have reviewed the triage vital signs and the nursing notes.  Pertinent labs & imaging results that were available during my care of the patient were reviewed by me and considered in my medical decision making (see chart for details).   22 year old otherwise healthy female presents for evaluation of ear pain and discharge.  Afebrile, nonseptic, non-ill-appearing.  Patient has been seen 5 times over the last 24 hours for same issue.  On her last visit patient received a CT temporal bones which was negative.  No foreign bodies in bilateral external canal.  No discharge, edema.  No evidence of otitis media or otitis externa.  No headache, neck stiffness.  Not feel patient needs any additional imaging at this time.  Discussed with patient normal exam findings and negative CT yesterday.  I feel her discharge from her right ear was from the OTC eardrops she has been putting in her ears as there is no active evidence of discharge during visit.  Discussed with patient her need to follow-up with ENT at this time.  States she did take Benadryl last night to sleep which has helped.  Discussed in depth with patient ear anatomy in her negative exam findings.  Patient she feels satisfied at this time.  This could possibly be a delusional parasitosis however she does not have any psychiatric history  denies any current depression, anxiety, SI, HI or AVH.  Patient is stable for discharge, is agreeable for follow-up with ENT.  Discussed reasons to return to the emergency department.  Patient voiced understanding.   Final Clinical Impressions(s) / ED Diagnoses   Final diagnoses:  Foreign body sensation in both ear canals    ED Discharge Orders    None       [place] Monds A, PA-C 01/23/18 0956    Little, Ambrose Finland, MD 01/24/18 1529

## 2018-01-24 ENCOUNTER — Other Ambulatory Visit: Payer: Self-pay

## 2018-01-24 ENCOUNTER — Emergency Department
Admission: EM | Admit: 2018-01-24 | Discharge: 2018-01-24 | Disposition: A | Discharge status: Home / Self Care | Payer: Self-pay | Attending: Emergency Medicine | Admitting: Emergency Medicine

## 2018-01-24 DIAGNOSIS — J45901 Unspecified asthma with (acute) exacerbation: Secondary | ICD-10-CM | POA: Insufficient documentation

## 2018-01-24 DIAGNOSIS — Z87891 Personal history of nicotine dependence: Secondary | ICD-10-CM | POA: Insufficient documentation

## 2018-01-24 DIAGNOSIS — B0221 Postherpetic geniculate ganglionitis: Secondary | ICD-10-CM | POA: Insufficient documentation

## 2018-01-24 MED ORDER — VALACYCLOVIR HCL 1 G PO TABS: 1000.0000 mg | ORAL_TABLET | Freq: Three times a day (TID) | ORAL | 0 refills | Status: AC

## 2018-01-24 MED ORDER — HYDROCODONE-ACETAMINOPHEN 5-325 MG PO TABS: 1.0000 | ORAL_TABLET | Freq: Four times a day (QID) | ORAL | 0 refills | Status: AC | PRN

## 2018-01-24 MED ORDER — HYDROCODONE-ACETAMINOPHEN 5-325 MG PO TABS
1.0000 | ORAL_TABLET | Freq: Once | ORAL | Status: AC
  Administered 2018-01-24: 1 via ORAL
  Filled 2018-01-24: qty 1

## 2018-01-24 MED ORDER — CIPROFLOXACIN-DEXAMETHASONE 0.3-0.1 % OT SUSP: 4.0000 [drp] | Freq: Two times a day (BID) | OTIC | 0 refills | Status: AC

## 2018-01-24 NOTE — ED Notes (Addendum)
See triage note  Presents with pain to right ear  Also states she thinks something may be in her ear.presents with a rash around outside of ear  Has been seen 4 times for same problem  Has been to [facility] and [facility] ED

## 2018-01-24 NOTE — ED Provider Notes (Signed)
[facility] Emergency Department Provider Note  ____________________________________________  Time seen: Approximately 4:59 PM  I have reviewed the triage vital signs and the nursing notes.   HISTORY  Chief Complaint Otalgia    HPI Jennifer Bird is a 22 y.o. female presents to the emergency department with 10 out of 10 right ear pain with rash.  Patient has been seen 5 times in the past 24 hours at both [facility] [facility] and [facility] EDs due to right ear pain.  Patient has only been prescribed Benadryl.  Associated symptoms include tinnitus, vertigo, headache and burning sensation of right external auditory canal.  Patient was exposed to chickenpox as a child and has not been vaccinated against varicella.  Patient has noticed bleeding from right external auditory canal.  No recent head trauma.  No other sick contacts in the home.  Patient has not been swimming recently.  No fever or chills.   Past Medical History:  Diagnosis Date  . Asthma   . Bronchitis     Patient Active Problem List   Diagnosis Date Noted  . Supervision of normal pregnancy, antepartum 04/18/2016    No past surgical history on file.  Prior to Admission medications   Medication Sig Start Date End Date Taking? Authorizing Provider  ciprofloxacin-dexamethasone (CIPRODEX) OTIC suspension Place 4 drops into the right ear 2 (two) times daily for 7 days. 01/24/18 01/31/18  Orvil Feil, PA-C  diphenhydrAMINE (BENADRYL) 25 mg capsule Take 1 capsule (25 mg total) by mouth every 6 (six) hours as needed. Patient not taking: Reported on 07/23/2016 06/21/16   Barrett Henle, PA-C  HYDROcodone-acetaminophen [facility]) 5-325 MG tablet Take 1 tablet by mouth every 6 (six) hours as needed for up to 3 days. 01/24/18 01/27/18  Orvil Feil, PA-C  hydrOXYzine (ATARAX/VISTARIL) 10 MG tablet Take 1 tablet (10 mg total) by mouth every 6 (six) hours as needed for itching. Patient not taking: Reported on  07/23/2016 06/21/16   Barrett Henle, PA-C  predniSONE (DELTASONE) 20 MG tablet Take 3 tablets (60 mg total) by mouth daily. Patient not taking: Reported on 07/23/2016 06/23/16   Elpidio Anis, PA-C  valACYclovir (VALTREX) 1000 MG tablet Take 1 tablet (1,000 mg total) by mouth 3 (three) times daily for 7 days. 01/24/18 01/31/18  Orvil Feil, PA-C    Allergies Codeine; Other; and Tomato  No family history on file.  Social History Social History   Tobacco Use  . Smoking status: Former Smoker    Types: Cigarettes  . Smokeless tobacco: Never Used  Substance Use Topics  . Alcohol use: No  . Drug use: No     Review of Systems  Constitutional: No fever/chills Eyes: No visual changes. No discharge ENT: Patient has right ear pain.  Cardiovascular: no chest pain. Respiratory: no cough. No SOB. Gastrointestinal: No abdominal pain.  No nausea, no vomiting.  No diarrhea.  No constipation. Genitourinary: Negative for dysuria. No hematuria Musculoskeletal: Negative for musculoskeletal pain. Skin: Negative for rash, abrasions, lacerations, ecchymosis. Neurological: Negative for headaches, focal weakness or numbness.   ____________________________________________   PHYSICAL EXAM:  VITAL SIGNS: ED Triage Vitals  Enc Vitals Group     BP 01/24/18 1543 111/75     Pulse Rate 01/24/18 1543 88     Resp --      Temp 01/24/18 1543 99.8 F (37.7 C)     Temp Source 01/24/18 1543 Oral     SpO2 01/24/18 1543 100 %  Weight 01/24/18 1543 176 lb (79.8 kg)     Height 01/24/18 1543 5\' 10"  (1.778 m)     Head Circumference --      Peak Flow --      Pain Score 01/24/18 1547 10     Pain Loc --      Pain Edu? --      Excl. in GC? --      Constitutional: Alert and oriented. Well appearing and in no acute distress. Eyes: Conjunctivae are normal. PERRL. EOMI. Head: Atraumatic.  No evidence of facial paralysis.  No lacrimation on the right ENT:      Ears: Patient has ruptured bulla of  the preauricular skin and of the external auditory canal with evidence of bleeding in external auditory canal.  Right TM is intact and is pearly.  Patient has exquisite pain with palpation of the tragus, right.  Left TM is pearly.  No major discomfort with palpation of the left tragus.      Nose: No congestion/rhinnorhea.      Mouth/Throat: Mucous membranes are moist.  Neck: No stridor.  No cervical spine tenderness to palpation. Hematological/Lymphatic/Immunilogical: No cervical lymphadenopathy. Cardiovascular: Normal rate, regular rhythm. Normal S1 and S2.  Good peripheral circulation. Respiratory: Normal respiratory effort without tachypnea or retractions. Lungs CTAB. Good air entry to the bases with no decreased or absent breath sounds. Gastrointestinal: Bowel sounds 4 quadrants. Soft and nontender to palpation. No guarding or rigidity. No palpable masses. No distention. No CVA tenderness. Musculoskeletal: Full range of motion to all extremities. No gross deformities appreciated. Neurologic:  Normal speech and language. No gross focal neurologic deficits are appreciated.  Skin:  Skin is warm, dry and intact. No rash noted. Psychiatric: Mood and affect are normal. Speech and behavior are normal. Patient exhibits appropriate insight and judgement.   ____________________________________________   LABS (all labs ordered are listed, but only abnormal results are displayed)  Labs Reviewed - No data to display ____________________________________________  EKG   ____________________________________________  RADIOLOGY   No results found.  ____________________________________________    PROCEDURES  Procedure(s) performed:    Procedures    Medications  HYDROcodone-acetaminophen (NORCO/VICODIN) 5-325 MG per tablet 1 tablet (1 tablet Oral Given 01/24/18 1653)     ____________________________________________   INITIAL IMPRESSION / ASSESSMENT AND PLAN / ED  COURSE  Pertinent labs & imaging results that were available during my care of the patient were reviewed by me and considered in my medical decision making (see chart for details).  Review of the [place] CSRS was performed in accordance of the [facility] prior to dispensing any controlled drugs.      Assessment and plan Ramsay Hunt Syndrome:  Patient presents to the emergency department with right ear pain, vertigo, tinnitus and rash.  Patient was extremely frustrated after being evaluated 5 times by other providers given severity of symptoms.  Patient reports that her pain originally started with right-sided tinnitus.  Physical exam findings are concerning for ruptured vesicles of the preauricular skin and external auditory canal.  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  Patient was started empirically on Valtrex.  Patient was also treated empirically with Ciprodex in case there is a bacterial component to patient's external auditory canal pain.  Patient was also treated with a short course of Norco for pain.  Patient was advised to follow-up with primary care as needed.  Patient education regarding avoiding contact with pregnant women, infants and immunocompromised adults were given.  Patient  voiced understanding.    ____________________________________________  FINAL CLINICAL IMPRESSION(S) / ED DIAGNOSES  Final diagnoses:  Ramsay Hunt auricular syndrome      NEW MEDICATIONS STARTED DURING THIS VISIT:  ED Discharge Orders         Ordered    ciprofloxacin-dexamethasone (CIPRODEX) OTIC suspension  2 times daily     01/24/18 1644    valACYclovir (VALTREX) 1000 MG tablet  3 times daily     01/24/18 1644    HYDROcodone-acetaminophen (NORCO) 5-325 MG tablet  Every 6 hours PRN     01/24/18 1651              This chart was dictated using voice recognition software/Dragon. Despite best efforts to proofread, errors can occur which can change the meaning.  Any change was purely unintentional.    Orvil Feil, PA-C 01/24/18 1706    Phineas Semen, MD 01/24/18 [phone]

## 2018-01-24 NOTE — ED Triage Notes (Signed)
Pt states that she feels like something is in her right ear, states that it feels like pressure and states it sounds like a staticky soiund

## 2018-04-23 ENCOUNTER — Other Ambulatory Visit: Payer: Self-pay

## 2018-04-23 ENCOUNTER — Emergency Department
Admission: EM | Admit: 2018-04-23 | Discharge: 2018-04-23 | Disposition: A | Discharge status: Home / Self Care | Payer: Self-pay | Attending: Emergency Medicine | Admitting: Emergency Medicine

## 2018-04-23 DIAGNOSIS — B0229 Other postherpetic nervous system involvement: Secondary | ICD-10-CM | POA: Insufficient documentation

## 2018-04-23 DIAGNOSIS — J45909 Unspecified asthma, uncomplicated: Secondary | ICD-10-CM | POA: Insufficient documentation

## 2018-04-23 DIAGNOSIS — Z87891 Personal history of nicotine dependence: Secondary | ICD-10-CM | POA: Insufficient documentation

## 2018-04-23 LAB — GROUP A STREP BY PCR: Group A Strep by PCR: NOT DETECTED

## 2018-04-23 MED ORDER — GABAPENTIN 300 MG PO CAPS
300.0000 mg | ORAL_CAPSULE | Freq: Once | ORAL | Status: AC
  Administered 2018-04-23: 300 mg via ORAL

## 2018-04-23 MED ORDER — GABAPENTIN 300 MG PO CAPS: 300.0000 mg | ORAL_CAPSULE | Freq: Three times a day (TID) | ORAL | 1 refills | Status: DC

## 2018-04-23 MED ORDER — GABAPENTIN 300 MG PO CAPS
300.0000 mg | ORAL_CAPSULE | Freq: Once | ORAL | Status: DC
Start: 2018-04-23 — End: 2018-04-23
  Filled 2018-04-23: qty 1

## 2018-04-23 MED ORDER — GABAPENTIN 600 MG PO TABS
300.0000 mg | ORAL_TABLET | Freq: Once | ORAL | Status: DC
  Filled 2018-04-23: qty 1

## 2018-04-23 NOTE — ED Notes (Signed)
Pt states that she has had a cough for the last couple of weeks but has gotten worse in the last couple of days. Pt states that she also wanted to know if there was anything she could take "to get rid of the pain and nerves that's left over from the shingles." Pt in NAD at this time.

## 2018-04-23 NOTE — ED Notes (Signed)
Pt was given a beverage per request

## 2018-04-23 NOTE — ED Triage Notes (Addendum)
Pt arrives to ED via POV from home with c/o "shingles" since October, but no rash, irritation, or vesicles present; pt states, "I can feel it in my nerves". Pt also reports sore throat that started today. No fever, no N/V/D.

## 2018-04-23 NOTE — ED Provider Notes (Signed)
[facility] Emergency Department Provider Note  ____________________________________________  Time seen: Approximately 9:47 PM  I have reviewed the triage vital signs and the nursing notes.   HISTORY  Chief Complaint Sore Throat and Rash    HPI Jennifer Bird is a 23 y.o. female who presents the emergency department complaining of a burning/tingling/crawling sensation of her right face.  Patient was diagnosed with shingles 3 months prior.  Patient has had ongoing symptoms after rash had resolved.  Initially, patient thought it would go away but states that it is now "driving me crazy."  No medications for his complaint.  She denies any headache, visual changes, nasal congestion, cough, shortness of breath, domino pain.  Patient reports that her throat is intermittently sore but she denies any difficulty swallowing.    Past Medical History:  Diagnosis Date  . Asthma   . Bronchitis     Patient Active Problem List   Diagnosis Date Noted  . Supervision of normal pregnancy, antepartum 04/18/2016    History reviewed. No pertinent surgical history.  Prior to Admission medications   Medication Sig Start Date End Date Taking? Authorizing Provider  diphenhydrAMINE (BENADRYL) 25 mg capsule Take 1 capsule (25 mg total) by mouth every 6 (six) hours as needed. Patient not taking: Reported on 07/23/2016 06/21/16   Barrett Henle, PA-C  gabapentin (NEURONTIN) 300 MG capsule Take 1 capsule (300 mg total) by mouth 3 (three) times daily. Take 1 tablet on day 1, 2 tablets on day 2, 3 tablets on day 3.  Continue dosing of 3 tablets daily 04/23/18 04/23/19  Caroline Matters, Delorise Royals, PA-C  hydrOXYzine (ATARAX/VISTARIL) 10 MG tablet Take 1 tablet (10 mg total) by mouth every 6 (six) hours as needed for itching. Patient not taking: Reported on 07/23/2016 06/21/16   Barrett Henle, PA-C  predniSONE (DELTASONE) 20 MG tablet Take 3 tablets (60 mg total) by mouth  daily. Patient not taking: Reported on 07/23/2016 06/23/16   Elpidio Anis, PA-C    Allergies Codeine; Other; and Tomato  No family history on file.  Social History Social History   Tobacco Use  . Smoking status: Former Smoker    Types: Cigarettes  . Smokeless tobacco: Never Used  Substance Use Topics  . Alcohol use: No  . Drug use: No     Review of Systems  Constitutional: No fever/chills Eyes: No visual changes. No discharge ENT: Intermittent sore throat Cardiovascular: no chest pain. Respiratory: no cough. No SOB. Gastrointestinal: No abdominal pain.  No nausea, no vomiting.   Musculoskeletal: Negative for musculoskeletal pain. Skin: Negative for rash, abrasions, lacerations, ecchymosis.   Neurological: Negative for headaches, focal weakness or numbness.Burning/crawling sensation to the right face and distribution of previously diagnosed shingles. 10-point ROS otherwise negative.  ____________________________________________   PHYSICAL EXAM:  VITAL SIGNS: ED Triage Vitals  Enc Vitals Group     BP 04/23/18 2040 117/70     Pulse Rate 04/23/18 2040 82     Resp 04/23/18 2040 16     Temp 04/23/18 2040 98.5 F (36.9 C)     Temp Source 04/23/18 2040 Oral     SpO2 04/23/18 2040 99 %     Weight 04/23/18 2036 167 lb (75.8 kg)     Height 04/23/18 2036 5\' 9"  (1.753 m)     Head Circumference --      Peak Flow --      Pain Score 04/23/18 2036 10     Pain Loc --  Pain Edu? --      Excl. in GC? --      Constitutional: Alert and oriented. Well appearing and in no acute distress. Eyes: Conjunctivae are normal. PERRL. EOMI. Head: Atraumatic. ENT:      Ears:       Nose: No congestion/rhinnorhea.      Mouth/Throat: Mucous membranes are moist.  Neck: No stridor.  No cervical spine tenderness to palpation.  Cardiovascular: Normal rate, regular rhythm. Normal S1 and S2.  Good peripheral circulation. Respiratory: Normal respiratory effort without tachypnea or  retractions. Lungs CTAB. Good air entry to the bases with no decreased or absent breath sounds. Musculoskeletal: Full range of motion to all extremities. No gross deformities appreciated. Neurologic:  Normal speech and language. No gross focal neurologic deficits are appreciated.  Cranial nerves II through XII grossly intact. Skin:  Skin is warm, dry and intact. No rash noted. Psychiatric: Mood and affect are normal. Speech and behavior are normal. Patient exhibits appropriate insight and judgement.   ____________________________________________   LABS (all labs ordered are listed, but only abnormal results are displayed)  Labs Reviewed  GROUP A STREP BY PCR   ____________________________________________  EKG   ____________________________________________  RADIOLOGY   No results found.  ____________________________________________    PROCEDURES  Procedure(s) performed:    Procedures    Medications - No data to display   ____________________________________________   INITIAL IMPRESSION / ASSESSMENT AND PLAN / ED COURSE  Pertinent labs & imaging results that were available during my care of the patient were reviewed by me and considered in my medical decision making (see chart for details).  Review of the [place] CSRS was performed in accordance of the [facility] prior to dispensing any controlled drugs.      Patient's diagnosis is consistent with post herpetic neuralgia.  Patient presents emergency department with residual symptoms after shingles infection to the face.  Skin symptoms completely resolved.  Patient continues to have a burning/crawling/tingling sensation along the trigeminal nerve distribution.  Exam was unremarkable.  Negative strep test.  Given known diagnosis of shingles, sensation, this is most consistent with postherpetic neuralgia.  Patient will be started on gabapentin for same.  Follow-up with primary care..  Patient is given ED precautions to return to  the ED for any worsening or new symptoms.     ____________________________________________  FINAL CLINICAL IMPRESSION(S) / ED DIAGNOSES  Final diagnoses:  HZV (herpes zoster virus) post herpetic neuralgia      NEW MEDICATIONS STARTED DURING THIS VISIT:  ED Discharge Orders         Ordered    gabapentin (NEURONTIN) 300 MG capsule  3 times daily,   Status:  Discontinued     04/23/18 2206    gabapentin (NEURONTIN) 300 MG capsule  3 times daily     04/23/18 2207              This chart was dictated using voice recognition software/Dragon. Despite best efforts to proofread, errors can occur which can change the meaning. Any change was purely unintentional.    Racheal PatchesCuthriell, Shiree Altemus D, PA-C 04/23/18 2207    Minna AntisPaduchowski, Kevin, MD 04/23/18 2326

## 2018-05-22 ENCOUNTER — Other Ambulatory Visit: Payer: Self-pay

## 2018-05-22 ENCOUNTER — Encounter (HOSPITAL_COMMUNITY): Payer: Self-pay | Admitting: *Deleted

## 2018-05-22 ENCOUNTER — Emergency Department (HOSPITAL_COMMUNITY): Payer: Self-pay

## 2018-05-22 ENCOUNTER — Emergency Department (HOSPITAL_COMMUNITY)
Admission: EM | Admit: 2018-05-22 | Discharge: 2018-05-22 | Disposition: A | Discharge status: Home / Self Care | Payer: Self-pay | Attending: Emergency Medicine | Admitting: Emergency Medicine

## 2018-05-22 DIAGNOSIS — J45909 Unspecified asthma, uncomplicated: Secondary | ICD-10-CM | POA: Insufficient documentation

## 2018-05-22 DIAGNOSIS — R0789 Other chest pain: Secondary | ICD-10-CM | POA: Insufficient documentation

## 2018-05-22 LAB — COMPREHENSIVE METABOLIC PANEL
ALT: 16 U/L (ref 0–44)
AST: 17 U/L (ref 15–41)
Albumin: 3.6 g/dL (ref 3.5–5.0)
Alkaline Phosphatase: 42 U/L (ref 38–126)
Anion gap: 6 (ref 5–15)
BILIRUBIN TOTAL: 0.6 mg/dL (ref 0.3–1.2)
BUN: 8 mg/dL (ref 6–20)
CHLORIDE: 108 mmol/L (ref 98–111)
CO2: 24 mmol/L (ref 22–32)
CREATININE: 0.86 mg/dL (ref 0.44–1.00)
Calcium: 9.1 mg/dL (ref 8.9–10.3)
Glucose, Bld: 75 mg/dL (ref 70–99)
Potassium: 4.2 mmol/L (ref 3.5–5.1)
Sodium: 138 mmol/L (ref 135–145)
TOTAL PROTEIN: 6.4 g/dL — AB (ref 6.5–8.1)

## 2018-05-22 LAB — CBC WITH DIFFERENTIAL/PLATELET
Abs Immature Granulocytes: 0.01 10*3/uL (ref 0.00–0.07)
Basophils Absolute: 0.1 10*3/uL (ref 0.0–0.1)
Basophils Relative: 1 %
EOS ABS: 0.1 10*3/uL (ref 0.0–0.5)
EOS PCT: 1 %
HEMATOCRIT: 38.3 % (ref 36.0–46.0)
Hemoglobin: 12.1 g/dL (ref 12.0–15.0)
Immature Granulocytes: 0 %
Lymphocytes Relative: 45 %
Lymphs Abs: 2.2 10*3/uL (ref 0.7–4.0)
MCH: 27.8 pg (ref 26.0–34.0)
MCHC: 31.6 g/dL (ref 30.0–36.0)
MCV: 88 fL (ref 80.0–100.0)
MONO ABS: 0.4 10*3/uL (ref 0.1–1.0)
MONOS PCT: 8 %
Neutro Abs: 2.2 10*3/uL (ref 1.7–7.7)
Neutrophils Relative %: 45 %
Platelets: 225 10*3/uL (ref 150–400)
RBC: 4.35 MIL/uL (ref 3.87–5.11)
RDW: 12 % (ref 11.5–15.5)
WBC: 4.9 10*3/uL (ref 4.0–10.5)
nRBC: 0 % (ref 0.0–0.2)

## 2018-05-22 LAB — LIPASE, BLOOD: LIPASE: 55 U/L — AB (ref 11–51)

## 2018-05-22 LAB — I-STAT TROPONIN, ED: TROPONIN I, POC: 0 ng/mL (ref 0.00–0.08)

## 2018-05-22 MED ORDER — NAPROXEN 500 MG PO TABS: 500.0000 mg | ORAL_TABLET | Freq: Two times a day (BID) | ORAL | 0 refills | Status: AC

## 2018-05-22 MED ORDER — ALUM & MAG HYDROXIDE-SIMETH 200-200-20 MG/5ML PO SUSP
30.0000 mL | Freq: Once | ORAL | Status: AC
  Administered 2018-05-22: 30 mL via ORAL
  Filled 2018-05-22: qty 30

## 2018-05-22 NOTE — Discharge Instructions (Signed)
Your xray today was normal. Your laboratory results were within normal limits. I believe you are likely suffering from chest wall pain, I have prescribed medication to help with your symptoms. Please follow up with your primary care physician. If you  do not have a PCP, I have attached the number to [facility], please schedule an appointment.

## 2018-05-22 NOTE — ED Provider Notes (Signed)
[facility] Provider [facility]   CSN: [id number] Arrival date & time: 05/22/18  1205     History   Chief Complaint Chief Complaint  Patient presents with  . Chest Pain    HPI Jennifer Bird is a 23 y.o. female.  23 y.o female with a PMH of Asthma, Bronchitis presents with a chief complaint of shortness of breath and chest pain x 3 days. Patient reports a pressure along the middle of her chest, she states work yesterday when she was lifting a box and she started feeling pressure on her middle chest.  She reports the area is worse with activity such as lifting.  She has not had this pain in the past, reports she has not taken any medical therapy for relieving symptoms.  She denies any cough or cold symptoms, fever, previous history of blood clots.     Past Medical History:  Diagnosis Date  . Asthma   . Bronchitis     Patient Active Problem List   Diagnosis Date Noted  . Supervision of normal pregnancy, antepartum 04/18/2016    History reviewed. No pertinent surgical history.   OB History    Gravida  2   Para      Term      Preterm      AB      Living        SAB      TAB      Ectopic      Multiple      Live Births               Home Medications    Prior to Admission medications   Medication Sig Start Date End Date Taking? Authorizing Provider  diphenhydrAMINE (BENADRYL) 25 mg capsule Take 1 capsule (25 mg total) by mouth every 6 (six) hours as needed. Patient not taking: Reported on 07/23/2016 06/21/16   Barrett HenleNadeau, Nicole Elizabeth, PA-C  gabapentin (NEURONTIN) 300 MG capsule Take 1 capsule (300 mg total) by mouth 3 (three) times daily. Take 1 tablet on day 1, 2 tablets on day 2, 3 tablets on day 3.  Continue dosing of 3 tablets daily 04/23/18 04/23/19  Cuthriell, Delorise RoyalsJonathan D, PA-C  hydrOXYzine (ATARAX/VISTARIL) 10 MG tablet Take 1 tablet (10 mg total) by mouth every 6 (six) hours as needed for itching. Patient not  taking: Reported on 07/23/2016 06/21/16   Barrett HenleNadeau, Nicole Elizabeth, PA-C  naproxen (NAPROSYN) 500 MG tablet Take 1 tablet (500 mg total) by mouth 2 (two) times daily for 7 days. 05/22/18 05/29/18  Claude MangesSoto, Aubry Tucholski, PA-C  predniSONE (DELTASONE) 20 MG tablet Take 3 tablets (60 mg total) by mouth daily. Patient not taking: Reported on 07/23/2016 06/23/16   Elpidio AnisUpstill, Shari, PA-C    Family History History reviewed. No pertinent family history.  Social History Social History   Tobacco Use  . Smoking status: Former Smoker    Types: Cigarettes  . Smokeless tobacco: Never Used  Substance Use Topics  . Alcohol use: No  . Drug use: No     Allergies   Codeine; Other; and Tomato   Review of Systems Review of Systems  Constitutional: Negative for chills and fever.  HENT: Negative for ear pain and sore throat.   Eyes: Negative for pain and visual disturbance.  Respiratory: Positive for shortness of breath. Negative for cough.   Cardiovascular: Positive for chest pain. Negative for palpitations.  Gastrointestinal: Negative for abdominal pain and vomiting.  Genitourinary: Negative for dysuria  and hematuria.  Musculoskeletal: Negative for arthralgias and back pain.  Skin: Negative for color change and rash.  Neurological: Negative for seizures and syncope.  All other systems reviewed and are negative.    Physical Exam Updated Vital Signs BP 113/69 (BP Location: Right Arm)   Pulse 90   Temp 99.1 F (37.3 C) (Oral)   Resp 18   Ht 5\' 9"  (1.753 m)   Wt 75 kg   LMP 05/08/2018   SpO2 100%   BMI 24.42 kg/m   Physical Exam Vitals signs and nursing note reviewed.  Constitutional:      General: She is not in acute distress.    Appearance: She is well-developed.  HENT:     Head: Normocephalic and atraumatic.     Mouth/Throat:     Pharynx: No oropharyngeal exudate.     Comments: No swelling, erythema, exudate or rhinorrhea present. Eyes:     Pupils: Pupils are equal, round, and reactive to  light.  Neck:     Musculoskeletal: Normal range of motion.  Cardiovascular:     Rate and Rhythm: Regular rhythm.     Heart sounds: Normal heart sounds.  Pulmonary:     Effort: Pulmonary effort is normal. No respiratory distress.     Breath sounds: Normal breath sounds. No decreased breath sounds or wheezing.     Comments: Lung sounds are clear to auscultation. No  Wheezing, rales, rhonchi. Chest:     Chest wall: Tenderness present.       Comments: No crepitus, edema, swelling.  Does report tenderness with palpation along midline. Abdominal:     General: Bowel sounds are normal. There is no distension.     Palpations: Abdomen is soft.     Tenderness: There is no abdominal tenderness.  Musculoskeletal:        General: No tenderness or deformity.     Right lower leg: No edema.     Left lower leg: No edema.  Skin:    General: Skin is warm and dry.  Neurological:     Mental Status: She is alert and oriented to person, place, and time.      ED Treatments / Results  Labs (all labs ordered are listed, but only abnormal results are displayed) Labs Reviewed  COMPREHENSIVE METABOLIC PANEL - Abnormal; Notable for the following components:      Result Value   Total Protein 6.4 (*)    All other components within normal limits  LIPASE, BLOOD - Abnormal; Notable for the following components:   Lipase 55 (*)    All other components within normal limits  CBC WITH DIFFERENTIAL/PLATELET  I-STAT TROPONIN, ED    EKG EKG Interpretation  Date/Time:  Thursday May 22 2018 12:13:47 EST Ventricular Rate:  93 PR Interval:  146 QRS Duration: 98 QT Interval:  354 QTC Calculation: 440 R Axis:   80 Text Interpretation:  Normal sinus rhythm Incomplete right bundle branch block Borderline ECG When compared to prior, no significant changes seen.  No STEMI Confirmed by Theda Belfast ([id number]) on 05/22/2018 12:54:21 PM   Radiology Dg Chest 2 View  Result Date: 05/22/2018 CLINICAL DATA:   Initial evaluation for acute chest pressure, history of chronic bronchitis. EXAM: CHEST - 2 VIEW COMPARISON:  Prior radiograph from 06/21/2014. FINDINGS: Cardiac and mediastinal silhouettes are stable in size and contour, and remain within normal limits. Lungs well inflated bilaterally. Mild scattered chronic bronchitic changes, stable from previous. No superimposed focal infiltrates. No pulmonary edema or pleural effusion.  No pneumothorax. Osseous structures within normal limits. IMPRESSION: 1. Mild scattered chronic bronchitic changes, stable from previous. 2. No other active cardiopulmonary disease. Electronically Signed   By: Rise MuBenjamin  McClintock M.D.   On: 05/22/2018 13:19    Procedures Procedures (including critical care time)  Medications Ordered in ED Medications  alum & mag hydroxide-simeth (MAALOX/MYLANTA) 200-200-20 MG/5ML suspension 30 mL (30 mLs Oral Given 05/22/18 1322)     Initial Impression / Assessment and Plan / ED Course  I have reviewed the triage vital signs and the nursing notes.  Pertinent labs & imaging results that were available during my care of the patient were reviewed by me and considered in my medical decision making (see chart for details).    Patient presents with chest pressure and shortness of breath at rest x 1 week. She states this is a new complaint. During evaluation patient was eating [employer] and laughing with girlfriend at the bedside. EKG obtained in the ED showed no changes consistent with infarct or STEMI. She is PERC negative, low suspicion for PE, no risk factors.  DG CHEST 2 view: 1. Mild scattered chronic bronchitic changes, stable from previous.  2. No other active cardiopulmonary disease.    Patient was informed of her results. She will be given [place] and wellness. She will be prescribed naproxen for symptomatic relieve. Return precautions provided at length.    Final Clinical Impressions(s) / ED Diagnoses   Final diagnoses:    Chest wall pain    ED Discharge Orders         Ordered    naproxen (NAPROSYN) 500 MG tablet  2 times daily     05/22/18 1422           Claude MangesSoto, Sumner Boesch, [place] JerseyPA-C 05/22/18 1441    Tegeler, Canary Brimhristopher J, MD 05/22/18 [phone]

## 2018-05-22 NOTE — ED Triage Notes (Signed)
PT reports Chest pressure since Monday and Pressure goes to back. Pt denies any N/V

## 2018-05-25 ENCOUNTER — Emergency Department (HOSPITAL_COMMUNITY)
Admission: EM | Admit: 2018-05-25 | Discharge: 2018-05-25 | Disposition: A | Discharge status: Home / Self Care | Payer: Self-pay | Attending: Emergency Medicine | Admitting: Emergency Medicine

## 2018-05-25 DIAGNOSIS — J45909 Unspecified asthma, uncomplicated: Secondary | ICD-10-CM | POA: Insufficient documentation

## 2018-05-25 DIAGNOSIS — N760 Acute vaginitis: Secondary | ICD-10-CM | POA: Insufficient documentation

## 2018-05-25 DIAGNOSIS — Z79899 Other long term (current) drug therapy: Secondary | ICD-10-CM | POA: Insufficient documentation

## 2018-05-25 DIAGNOSIS — A749 Chlamydial infection, unspecified: Secondary | ICD-10-CM | POA: Insufficient documentation

## 2018-05-25 DIAGNOSIS — Z87891 Personal history of nicotine dependence: Secondary | ICD-10-CM | POA: Insufficient documentation

## 2018-05-25 DIAGNOSIS — B9689 Other specified bacterial agents as the cause of diseases classified elsewhere: Secondary | ICD-10-CM | POA: Insufficient documentation

## 2018-05-25 LAB — URINALYSIS, ROUTINE W REFLEX MICROSCOPIC
BILIRUBIN URINE: NEGATIVE
Glucose, UA: NEGATIVE mg/dL
Hgb urine dipstick: NEGATIVE
Ketones, ur: NEGATIVE mg/dL
Nitrite: NEGATIVE
Protein, ur: NEGATIVE mg/dL
Specific Gravity, Urine: 1.018 (ref 1.005–1.030)
pH: 5 (ref 5.0–8.0)

## 2018-05-25 LAB — WET PREP, GENITAL
Clue Cells Wet Prep HPF POC: NONE SEEN
Sperm: NONE SEEN
Trich, Wet Prep: NONE SEEN
YEAST WET PREP: NONE SEEN

## 2018-05-25 LAB — PREGNANCY, URINE: Preg Test, Ur: NEGATIVE

## 2018-05-25 MED ORDER — METRONIDAZOLE 500 MG PO TABS: 500.0000 mg | ORAL_TABLET | Freq: Two times a day (BID) | ORAL | 0 refills | Status: DC

## 2018-05-25 NOTE — ED Provider Notes (Signed)
[facility] Provider Note   CSN: [id number] Arrival date & time: 05/25/18  1339     History   Chief Complaint Chief Complaint  Patient presents with  . Vaginal Discharge  . Vaginal Pain    HPI Jennifer Bird is a 23 y.o. female.  HPI 23 year old female G1P LMP 2 weeks ago a normal cycle presents today complaining of burning with urination.  She reports sexual activity with female partner only.  She denies any abnormal vaginal discharge, increased frequency of urination, fever, or chills.  She reports that today she has had burning on the outside when she urinates.  She has not noted any sores and has had no similar symptoms in the past. Past Medical History:  Diagnosis Date  . Asthma   . Bronchitis     Patient Active Problem List   Diagnosis Date Noted  . Supervision of normal pregnancy, antepartum 04/18/2016    No past surgical history on file.   OB History    Gravida  2   Para      Term      Preterm      AB      Living        SAB      TAB      Ectopic      Multiple      Live Births               Home Medications    Prior to Admission medications   Medication Sig Start Date End Date Taking? Authorizing Provider  diphenhydrAMINE (BENADRYL) 25 mg capsule Take 1 capsule (25 mg total) by mouth every 6 (six) hours as needed. Patient not taking: Reported on 07/23/2016 06/21/16   Barrett HenleNadeau, Nicole Elizabeth, PA-C  gabapentin (NEURONTIN) 300 MG capsule Take 1 capsule (300 mg total) by mouth 3 (three) times daily. Take 1 tablet on day 1, 2 tablets on day 2, 3 tablets on day 3.  Continue dosing of 3 tablets daily 04/23/18 04/23/19  Cuthriell, Delorise RoyalsJonathan D, PA-C  hydrOXYzine (ATARAX/VISTARIL) 10 MG tablet Take 1 tablet (10 mg total) by mouth every 6 (six) hours as needed for itching. Patient not taking: Reported on 07/23/2016 06/21/16   Barrett HenleNadeau, Nicole Elizabeth, PA-C  naproxen (NAPROSYN) 500 MG tablet Take 1 tablet (500 mg  total) by mouth 2 (two) times daily for 7 days. 05/22/18 05/29/18  Claude MangesSoto, Johana, PA-C  predniSONE (DELTASONE) 20 MG tablet Take 3 tablets (60 mg total) by mouth daily. Patient not taking: Reported on 07/23/2016 06/23/16   Elpidio AnisUpstill, Shari, PA-C    Family History No family history on file.  Social History Social History   Tobacco Use  . Smoking status: Former Smoker    Types: Cigarettes  . Smokeless tobacco: Never Used  Substance Use Topics  . Alcohol use: No  . Drug use: No     Allergies   Codeine; Other; and Tomato   Review of Systems Review of Systems  All other systems reviewed and are negative.    Physical Exam Updated Vital Signs BP 126/72   Pulse 73   Temp 98.6 F (37 C) (Oral)   Resp 16   LMP 05/08/2018   SpO2 99%   Physical Exam Vitals signs and nursing note reviewed.  HENT:     Head: Normocephalic.     Right Ear: External ear normal.     Left Ear: External ear normal.     Nose: Nose normal.  Mouth/Throat:     Mouth: Mucous membranes are moist.  Eyes:     Pupils: Pupils are equal, round, and reactive to light.  Neck:     Musculoskeletal: Normal range of motion.  Cardiovascular:     Rate and Rhythm: Normal rate and regular rhythm.  Pulmonary:     Effort: Pulmonary effort is normal.     Breath sounds: Normal breath sounds.  Abdominal:     General: Abdomen is flat. Bowel sounds are normal.     Palpations: Abdomen is soft.  Genitourinary:    General: Normal vulva.     Vagina: No vaginal discharge.     Comments: Bimanual exam reveals nontender normal-sized uterus no adnexal masses are noted Musculoskeletal: Normal range of motion.  Skin:    General: Skin is warm and dry.     Capillary Refill: Capillary refill takes less than 2 seconds.  Neurological:     General: No focal deficit present.     Mental Status: She is alert. Mental status is at baseline.      ED Treatments / Results  Labs (all labs ordered are listed, but only abnormal results  are displayed) Labs Reviewed  WET PREP, GENITAL - Abnormal; Notable for the following components:      Result Value   WBC, Wet Prep HPF POC MODERATE (*)    All other components within normal limits  URINALYSIS, ROUTINE W REFLEX MICROSCOPIC - Abnormal; Notable for the following components:   APPearance CLOUDY (*)    Leukocytes, UA MODERATE (*)    Bacteria, UA RARE (*)    All other components within normal limits  PREGNANCY, URINE  GC/CHLAMYDIA PROBE AMP ([place]) NOT AT [facility]    EKG None  Radiology No results found.  Procedures Procedures (including critical care time)  Medications Ordered in ED Medications - No data to display   Initial Impression / Assessment and Plan / ED Course  I have reviewed the triage vital signs and the nursing notes.  Pertinent labs & imaging results that were available during my care of the patient were reviewed by me and considered in my medical decision making (see chart for details).     Final Clinical Impressions(s) / ED Diagnoses   Final diagnoses:  BV (bacterial vaginosis)    ED Discharge Orders    None       Margarita Grizzle, MD 05/25/18 1526

## 2018-05-26 LAB — GC/CHLAMYDIA PROBE AMP (~~LOC~~) NOT AT ARMC
Chlamydia: POSITIVE — AB
Neisseria Gonorrhea: NEGATIVE

## 2018-05-27 ENCOUNTER — Emergency Department (HOSPITAL_COMMUNITY)
Admission: EM | Admit: 2018-05-27 | Discharge: 2018-05-27 | Disposition: A | Discharge status: Home / Self Care | Payer: Self-pay | Attending: Emergency Medicine | Admitting: Emergency Medicine

## 2018-05-27 ENCOUNTER — Other Ambulatory Visit: Payer: Self-pay

## 2018-05-27 ENCOUNTER — Encounter (HOSPITAL_COMMUNITY): Payer: Self-pay | Admitting: Emergency Medicine

## 2018-05-27 DIAGNOSIS — R51 Headache: Secondary | ICD-10-CM | POA: Insufficient documentation

## 2018-05-27 DIAGNOSIS — H9202 Otalgia, left ear: Secondary | ICD-10-CM | POA: Insufficient documentation

## 2018-05-27 DIAGNOSIS — Z5321 Procedure and treatment not carried out due to patient leaving prior to being seen by health care provider: Secondary | ICD-10-CM | POA: Insufficient documentation

## 2018-05-27 NOTE — ED Notes (Signed)
Pt called x2 for vitals, no response. 

## 2018-05-27 NOTE — ED Notes (Signed)
No answer in waiting room 

## 2018-05-27 NOTE — ED Triage Notes (Signed)
C/o sharp pain behind L ear that feels like something is biting her intermittently x 1 week.  Reports pressure to head.  States symptoms feel the same as her shingles that were diagnosed in October.

## 2018-05-29 ENCOUNTER — Encounter (HOSPITAL_COMMUNITY): Payer: Self-pay

## 2018-05-29 ENCOUNTER — Emergency Department (HOSPITAL_COMMUNITY)
Admission: EM | Admit: 2018-05-29 | Discharge: 2018-05-29 | Disposition: A | Discharge status: Home / Self Care | Payer: Self-pay | Attending: Emergency Medicine | Admitting: Emergency Medicine

## 2018-05-29 ENCOUNTER — Other Ambulatory Visit: Payer: Self-pay

## 2018-05-29 DIAGNOSIS — Z79899 Other long term (current) drug therapy: Secondary | ICD-10-CM | POA: Insufficient documentation

## 2018-05-29 DIAGNOSIS — A749 Chlamydial infection, unspecified: Secondary | ICD-10-CM | POA: Insufficient documentation

## 2018-05-29 DIAGNOSIS — J45909 Unspecified asthma, uncomplicated: Secondary | ICD-10-CM | POA: Insufficient documentation

## 2018-05-29 DIAGNOSIS — Z87891 Personal history of nicotine dependence: Secondary | ICD-10-CM | POA: Insufficient documentation

## 2018-05-29 MED ORDER — AZITHROMYCIN 1 G PO PACK
1.0000 g | PACK | Freq: Once | ORAL | Status: AC
  Administered 2018-05-29: 1 g via ORAL
  Filled 2018-05-29: qty 1

## 2018-05-29 MED ORDER — CEFTRIAXONE SODIUM 250 MG IJ SOLR
250.0000 mg | Freq: Once | INTRAMUSCULAR | Status: AC
  Administered 2018-05-29: 250 mg via INTRAMUSCULAR
  Filled 2018-05-29: qty 250

## 2018-05-29 MED ORDER — LIDOCAINE HCL 2 % IJ SOLN
5.0000 mL | Freq: Once | INTRAMUSCULAR | Status: AC
  Administered 2018-05-29: 100 mg
  Filled 2018-05-29: qty 20

## 2018-05-29 NOTE — Discharge Instructions (Addendum)
The antibiotics you were given today should treat the chlamydia infection.  Please have your partner go to the health department to get treatment

## 2018-05-29 NOTE — ED Triage Notes (Signed)
Pt reports she was called with results saying GC swab was positive and she needs treatment. Has been taking abx at home for BV.

## 2018-05-29 NOTE — ED Provider Notes (Signed)
[facility] EMERGENCY DEPARTMENT Provider Note   CSN: [id number] Arrival date & time: 05/29/18  1358     History   Chief Complaint Chief Complaint  Patient presents with  . Exposure to STD    HPI Jennifer Bird is a 23 y.o. female.  HPI Patient presents to the emergency room for treatment of a positive STD test.  Patient was in the emergency room recently.  She had a GC chlamydia swab.  Patient states she was called and told that her gonorrhea test was positive and she needed treatment.  Patient states she feels fine.  She is not having any abdominal pain.  She denies any fevers or chills. Past Medical History:  Diagnosis Date  . Asthma   . Bronchitis     Patient Active Problem List   Diagnosis Date Noted  . Supervision of normal pregnancy, antepartum 04/18/2016    History reviewed. No pertinent surgical history.   OB History    Gravida  2   Para      Term      Preterm      AB      Living        SAB      TAB      Ectopic      Multiple      Live Births               Home Medications    Prior to Admission medications   Medication Sig Start Date End Date Taking? Authorizing Provider  diphenhydrAMINE (BENADRYL) 25 mg capsule Take 1 capsule (25 mg total) by mouth every 6 (six) hours as needed. Patient not taking: Reported on 07/23/2016 06/21/16   Barrett Henle, PA-C  gabapentin (NEURONTIN) 300 MG capsule Take 1 capsule (300 mg total) by mouth 3 (three) times daily. Take 1 tablet on day 1, 2 tablets on day 2, 3 tablets on day 3.  Continue dosing of 3 tablets daily 04/23/18 04/23/19  Cuthriell, Delorise Royals, PA-C  hydrOXYzine (ATARAX/VISTARIL) 10 MG tablet Take 1 tablet (10 mg total) by mouth every 6 (six) hours as needed for itching. Patient not taking: Reported on 07/23/2016 06/21/16   Barrett Henle, PA-C  metroNIDAZOLE (FLAGYL) 500 MG tablet Take 1 tablet (500 mg total) by mouth 2 (two) times daily. 05/25/18   Margarita Grizzle, MD  naproxen (NAPROSYN) 500 MG tablet Take 1 tablet (500 mg total) by mouth 2 (two) times daily for 7 days. 05/22/18 05/29/18  Claude Manges, PA-C  predniSONE (DELTASONE) 20 MG tablet Take 3 tablets (60 mg total) by mouth daily. Patient not taking: Reported on 07/23/2016 06/23/16   Elpidio Anis, PA-C    Family History History reviewed. No pertinent family history.  Social History Social History   Tobacco Use  . Smoking status: Former Smoker    Types: Cigarettes  . Smokeless tobacco: Never Used  Substance Use Topics  . Alcohol use: No  . Drug use: No     Allergies   Codeine; Other; and Tomato   Review of Systems Review of Systems  All other systems reviewed and are negative.    Physical Exam Updated Vital Signs BP 126/86 (BP Location: Left Arm)   Pulse 76   Temp 99.2 F (37.3 C)   Resp 18   LMP 05/08/2018   SpO2 100%   Physical Exam Vitals signs and nursing note reviewed.  Constitutional:      General: She is not in acute distress.  Appearance: She is well-developed.  HENT:     Head: Normocephalic and atraumatic.     Right Ear: External ear normal.     Left Ear: External ear normal.  Eyes:     General: No scleral icterus.       Right eye: No discharge.        Left eye: No discharge.     Conjunctiva/sclera: Conjunctivae normal.  Neck:     Musculoskeletal: Neck supple.     Trachea: No tracheal deviation.  Cardiovascular:     Rate and Rhythm: Normal rate and regular rhythm.  Pulmonary:     Effort: Pulmonary effort is normal. No respiratory distress.     Breath sounds: Normal breath sounds. No stridor. No wheezing or rales.  Abdominal:     General: Bowel sounds are normal. There is no distension.     Palpations: Abdomen is soft.     Tenderness: There is no abdominal tenderness. There is no guarding or rebound.  Musculoskeletal:        General: No tenderness.  Skin:    General: Skin is warm and dry.     Findings: No rash.  Neurological:      Mental Status: She is alert.     Cranial Nerves: No cranial nerve deficit (no facial droop, extraocular movements intact, no slurred speech).     Sensory: No sensory deficit.     Motor: No abnormal muscle tone or seizure activity.     Coordination: Coordination normal.      ED Treatments / Results   Procedures Procedures (including critical care time)  Medications Ordered in ED Medications  cefTRIAXone (ROCEPHIN) injection 250 mg (250 mg Intramuscular Given 05/29/18 1533)  azithromycin (ZITHROMAX) powder 1 g (1 g Oral Given 05/29/18 1534)  lidocaine (XYLOCAINE) 2 % (with pres) injection 100 mg (100 mg Other Given 05/29/18 1533)     Initial Impression / Assessment and Plan / ED Course  I have reviewed the triage vital signs and the nursing notes.  Pertinent labs & imaging results that were available during my care of the patient were reviewed by me and considered in my medical decision making (see chart for details).   Prior labs were reviewed.  Patient was actually positive for chlamydia and gonorrhea.  However I will treat her with Rocephin and azithromycin.  Patient instructed to have her partner treated.  Final Clinical Impressions(s) / ED Diagnoses   Final diagnoses:  Chlamydia infection    ED Discharge Orders    None       Linwood DibblesKnapp, Leasha Goldberger, MD 05/29/18 1538

## 2018-06-13 ENCOUNTER — Other Ambulatory Visit: Payer: Self-pay

## 2018-06-13 ENCOUNTER — Encounter (HOSPITAL_COMMUNITY): Payer: Self-pay | Admitting: *Deleted

## 2018-06-13 ENCOUNTER — Emergency Department (HOSPITAL_COMMUNITY): Payer: Self-pay

## 2018-06-13 ENCOUNTER — Emergency Department (HOSPITAL_COMMUNITY)
Admission: EM | Admit: 2018-06-13 | Discharge: 2018-06-14 | Disposition: A | Discharge status: Home / Self Care | Payer: Self-pay | Attending: Emergency Medicine | Admitting: Emergency Medicine

## 2018-06-13 DIAGNOSIS — Z5321 Procedure and treatment not carried out due to patient leaving prior to being seen by health care provider: Secondary | ICD-10-CM | POA: Insufficient documentation

## 2018-06-13 DIAGNOSIS — R079 Chest pain, unspecified: Secondary | ICD-10-CM | POA: Insufficient documentation

## 2018-06-13 LAB — BASIC METABOLIC PANEL
Anion gap: 8 (ref 5–15)
BUN: 8 mg/dL (ref 6–20)
CO2: 24 mmol/L (ref 22–32)
CREATININE: 0.75 mg/dL (ref 0.44–1.00)
Calcium: 9.4 mg/dL (ref 8.9–10.3)
Chloride: 103 mmol/L (ref 98–111)
GFR calc Af Amer: >60 mL/min (ref >60)
GFR calc non Af Amer: >60 mL/min (ref >60)
Glucose, Bld: 96 mg/dL (ref 70–99)
Potassium: 4.1 mmol/L (ref 3.5–5.1)
Sodium: 135 mmol/L (ref 135–145)

## 2018-06-13 LAB — I-STAT TROPONIN, ED: Troponin i, poc: 0 ng/mL (ref 0.00–0.08)

## 2018-06-13 LAB — CBC
HCT: 41.5 % (ref 36.0–46.0)
Hemoglobin: 13.5 g/dL (ref 12.0–15.0)
MCH: 28.1 pg (ref 26.0–34.0)
MCHC: 32.5 g/dL (ref 30.0–36.0)
MCV: 86.5 fL (ref 80.0–100.0)
NRBC: 0 % (ref 0.0–0.2)
Platelets: 239 10*3/uL (ref 150–400)
RBC: 4.8 MIL/uL (ref 3.87–5.11)
RDW: 11.9 % (ref 11.5–15.5)
WBC: 6.3 10*3/uL (ref 4.0–10.5)

## 2018-06-13 LAB — I-STAT BETA HCG BLOOD, ED (MC, WL, AP ONLY): I-stat hCG, quantitative: <5 m[IU]/mL (ref <5)

## 2018-06-13 MED ORDER — SODIUM CHLORIDE 0.9% FLUSH: 3.0000 mL | Freq: Once | INTRAVENOUS | Status: DC

## 2018-06-13 NOTE — ED Triage Notes (Signed)
Pt has been having chest pain radiating into her L jaw and L arm for the past couple of days. Feels that her heart is racing at times.

## 2018-06-14 NOTE — ED Notes (Signed)
Pt seen walking out shortly after being updated about wait.  Pt states she is leaving.  Encouraged her to stay and she declines.

## 2018-08-22 ENCOUNTER — Encounter: Payer: Self-pay | Admitting: Emergency Medicine

## 2018-08-22 ENCOUNTER — Other Ambulatory Visit: Payer: Self-pay

## 2018-08-22 ENCOUNTER — Emergency Department
Admission: EM | Admit: 2018-08-22 | Discharge: 2018-08-22 | Disposition: A | Discharge status: Home / Self Care | Payer: Self-pay | Attending: Emergency Medicine | Admitting: Emergency Medicine

## 2018-08-22 DIAGNOSIS — Z79899 Other long term (current) drug therapy: Secondary | ICD-10-CM | POA: Insufficient documentation

## 2018-08-22 DIAGNOSIS — Z87891 Personal history of nicotine dependence: Secondary | ICD-10-CM | POA: Insufficient documentation

## 2018-08-22 DIAGNOSIS — B0222 Postherpetic trigeminal neuralgia: Secondary | ICD-10-CM | POA: Insufficient documentation

## 2018-08-22 DIAGNOSIS — J45909 Unspecified asthma, uncomplicated: Secondary | ICD-10-CM | POA: Insufficient documentation

## 2018-08-22 DIAGNOSIS — L509 Urticaria, unspecified: Secondary | ICD-10-CM | POA: Insufficient documentation

## 2018-08-22 MED ORDER — FLUTICASONE PROPIONATE 50 MCG/ACT NA SUSP: 1.0000 | Freq: Two times a day (BID) | NASAL | 0 refills | Status: DC

## 2018-08-22 MED ORDER — PREDNISONE 50 MG PO TABS: 50.0000 mg | ORAL_TABLET | Freq: Every day | ORAL | 0 refills | Status: DC

## 2018-08-22 MED ORDER — LEVOCETIRIZINE DIHYDROCHLORIDE 5 MG PO TABS: 5.0000 mg | ORAL_TABLET | Freq: Every day | ORAL | 2 refills | Status: DC

## 2018-08-22 MED ORDER — FAMOTIDINE 20 MG PO TABS: 20.0000 mg | ORAL_TABLET | Freq: Every day | ORAL | 0 refills | Status: DC

## 2018-08-22 NOTE — ED Notes (Signed)
See triage note. Pt in NAD, no s/sx allergic reaction noted at this time. Pt yelling at someone on phone, asked pt to please keep voice down.

## 2018-08-22 NOTE — ED Triage Notes (Signed)
Pt with red itchy rash that has been coming and going x 4 weeks. Pt has pictures of whelps from today, they have resolved now. Pt is concerned that there are bugs inside her.

## 2018-08-22 NOTE — ED Provider Notes (Signed)
[facility] Emergency Department Provider Note  ____________________________________________  Time seen: Approximately 3:52 PM  I have reviewed the triage vital signs and the nursing notes.   HISTORY  Chief Complaint Allergic Reaction    HPI Harnoor Merkle is a 23 y.o. female who presents the emergency department complaining of multiple complaints.  Patient reports that she had history of shingles to the right trigeminal nerve distribution in October.  Since then she has had intermittent pain, sensory changes to this nerve distribution.  Patient had been started on gabapentin for postherpetic neuralgia but states that this caused her to have mood swings, angry outburst.  Patient stopped this medication.  For the past several weeks, patient has been having intermittent episodes of urticaria.  Patient denies any new recent foods, medications, topicals, cleaning supplies.  Patient reports that she has been under extra stress recently.  She takes Benadryl which does improve the symptoms.  She is also trying topical cortisone.  Patient reports that occasionally it feels like bugs are crawling underneath her skin.  Patient reports that she knows that they are not physically present but states that is what the skin sensation feels like.  Has had some increased anxiety but does not take any medications for same.  Patient is interested in following up with primary care.         Past Medical History:  Diagnosis Date  . Asthma   . Bronchitis     Patient Active Problem List   Diagnosis Date Noted  . Supervision of normal pregnancy, antepartum 04/18/2016    History reviewed. No pertinent surgical history.  Prior to Admission medications   Medication Sig Start Date End Date Taking? Authorizing Provider  diphenhydrAMINE (BENADRYL) 25 mg capsule Take 1 capsule (25 mg total) by mouth every 6 (six) hours as needed. Patient not taking: Reported on 07/23/2016 06/21/16   Barrett Henle, PA-C  famotidine (PEPCID) 20 MG tablet Take 1 tablet (20 mg total) by mouth daily for 5 days. 08/22/18 08/27/18  Katrinia Straker, Delorise Royals, PA-C  fluticasone (FLONASE) 50 MCG/ACT nasal spray Place 1 spray into both nostrils 2 (two) times daily. 08/22/18   Marieann Zipp, Delorise Royals, PA-C  gabapentin (NEURONTIN) 300 MG capsule Take 1 capsule (300 mg total) by mouth 3 (three) times daily. Take 1 tablet on day 1, 2 tablets on day 2, 3 tablets on day 3.  Continue dosing of 3 tablets daily 04/23/18 04/23/19  Donya Tomaro, Delorise Royals, PA-C  hydrOXYzine (ATARAX/VISTARIL) 10 MG tablet Take 1 tablet (10 mg total) by mouth every 6 (six) hours as needed for itching. Patient not taking: Reported on 07/23/2016 06/21/16   Barrett Henle, PA-C  levocetirizine (XYZAL) 5 MG tablet Take 1 tablet (5 mg total) by mouth daily. 08/22/18   Jaqulyn Chancellor, Delorise Royals, PA-C  metroNIDAZOLE (FLAGYL) 500 MG tablet Take 1 tablet (500 mg total) by mouth 2 (two) times daily. 05/25/18   Margarita Grizzle, MD  predniSONE (DELTASONE) 50 MG tablet Take 1 tablet (50 mg total) by mouth daily with breakfast. 08/22/18   Hyden Soley, Delorise Royals, PA-C    Allergies Codeine; Other; and Tomato  History reviewed. No pertinent family history.  Social History Social History   Tobacco Use  . Smoking status: Former Smoker    Types: Cigarettes  . Smokeless tobacco: Never Used  Substance Use Topics  . Alcohol use: No  . Drug use: No     Review of Systems  Constitutional: No fever/chills Eyes: No visual changes. No  discharge ENT: No upper respiratory complaints. Cardiovascular: no chest pain. Respiratory: no cough. No SOB. Gastrointestinal: No abdominal pain.  No nausea, no vomiting.  No diarrhea.  No constipation. Musculoskeletal: Negative for musculoskeletal pain. Skin: Intermittent scattered hives.  Ongoing postherpetic neuralgia to the right trigeminal nerve distribution after shingles. Neurological: Negative for headaches, focal weakness  or numbness. 10-point ROS otherwise negative.  ____________________________________________   PHYSICAL EXAM:  VITAL SIGNS: ED Triage Vitals  Enc Vitals Group     BP 08/22/18 1421 107/76     Pulse Rate 08/22/18 1421 88     Resp --      Temp 08/22/18 1421 98.5 F (36.9 C)     Temp Source 08/22/18 1421 Oral     SpO2 08/22/18 1421 97 %     Weight 08/22/18 1424 170 lb (77.1 kg)     Height 08/22/18 1424 5\' 9"  (1.753 m)     Head Circumference --      Peak Flow --      Pain Score 08/22/18 1424 0     Pain Loc --      Pain Edu? --      Excl. in GC? --      Constitutional: Alert and oriented. Well appearing and in no acute distress. Eyes: Conjunctivae are normal. PERRL. EOMI. Head: Atraumatic. ENT:      Ears:       Nose: No congestion/rhinnorhea.      Mouth/Throat: Mucous membranes are moist.  Neck: No stridor.   Hematological/Lymphatic/Immunilogical: No cervical lymphadenopathy. Cardiovascular: Normal rate, regular rhythm. Normal S1 and S2.  Good peripheral circulation. Respiratory: Normal respiratory effort without tachypnea or retractions. Lungs CTAB. Good air entry to the bases with no decreased or absent breath sounds. Musculoskeletal: Full range of motion to all extremities. No gross deformities appreciated. Neurologic:  Normal speech and language. No gross focal neurologic deficits are appreciated.  Skin:  Skin is warm, dry and intact.  No appreciable rash noted at this time.  Patient does have images on her phone from previous outbreak which is consistent with hives.  Again, no residual rash appreciated. Psychiatric: Mood and affect are normal. Speech and behavior are normal. Patient exhibits appropriate insight and judgement.   ____________________________________________   LABS (all labs ordered are listed, but only abnormal results are displayed)  Labs Reviewed - No data to  display ____________________________________________  EKG   ____________________________________________  RADIOLOGY   No results found.  ____________________________________________    PROCEDURES  Procedure(s) performed:    Procedures    Medications - No data to display   ____________________________________________   INITIAL IMPRESSION / ASSESSMENT AND PLAN / ED COURSE  Pertinent labs & imaging results that were available during my care of the patient were reviewed by me and considered in my medical decision making (see chart for details).  Review of the [place] CSRS was performed in accordance of the [facility] prior to dispensing any controlled drugs.           Patient's diagnosis is consistent with urticaria, trigeminal neuralgia.  Patient presents to the emergency department with intermittent urticaria.  I suspect that this is stress/anxiety that he had oozed.  Patient with no history of psychiatric illness.  Does not take any antipsychotics at this time.  I will place the patient on Xyzal, prednisone and famotidine to ensure this is not allergic related.  Patient has had some mild allergy related symptoms for which she will be given a prescription for Flonase as well.  In regards to anxiety and history of trigeminal neuralgia, I do suspect that an course of a tricyclic antidepressant may be beneficial.  However, I have recommended that the patient establish with primary care and let them manage ongoing symptoms.  She verbalizes understanding and agreement with this plan..Patient is given ED precautions to return to the ED for any worsening or new symptoms.     ____________________________________________  FINAL CLINICAL IMPRESSION(S) / ED DIAGNOSES  Final diagnoses:  Urticaria  Trigeminal neuralgia, postherpetic      NEW MEDICATIONS STARTED DURING THIS VISIT:  ED Discharge Orders         Ordered    levocetirizine (XYZAL) 5 MG tablet  Daily     08/22/18 1622     fluticasone (FLONASE) 50 MCG/ACT nasal spray  2 times daily     08/22/18 1622    predniSONE (DELTASONE) 50 MG tablet  Daily with breakfast     08/22/18 1622    famotidine (PEPCID) 20 MG tablet  Daily     08/22/18 1622              This chart was dictated using voice recognition software/Dragon. Despite best efforts to proofread, errors can occur which can change the meaning. Any change was purely unintentional.    Racheal Patches, PA-C 08/22/18 1627    Sharyn Creamer, MD 08/22/18 2358

## 2018-10-17 ENCOUNTER — Emergency Department (HOSPITAL_COMMUNITY)
Admission: EM | Admit: 2018-10-17 | Discharge: 2018-10-17 | Disposition: A | Discharge status: Home / Self Care | Payer: Self-pay | Attending: Emergency Medicine | Admitting: Emergency Medicine

## 2018-10-17 ENCOUNTER — Encounter (HOSPITAL_COMMUNITY): Payer: Self-pay | Admitting: Emergency Medicine

## 2018-10-17 ENCOUNTER — Other Ambulatory Visit: Payer: Self-pay

## 2018-10-17 DIAGNOSIS — Z79899 Other long term (current) drug therapy: Secondary | ICD-10-CM | POA: Insufficient documentation

## 2018-10-17 DIAGNOSIS — H9201 Otalgia, right ear: Secondary | ICD-10-CM | POA: Insufficient documentation

## 2018-10-17 DIAGNOSIS — Z87891 Personal history of nicotine dependence: Secondary | ICD-10-CM | POA: Insufficient documentation

## 2018-10-17 DIAGNOSIS — J45909 Unspecified asthma, uncomplicated: Secondary | ICD-10-CM | POA: Insufficient documentation

## 2018-10-17 MED ORDER — LEVOCETIRIZINE DIHYDROCHLORIDE 5 MG PO TABS: 5.0000 mg | ORAL_TABLET | Freq: Every evening | ORAL | 0 refills | Status: DC

## 2018-10-17 MED ORDER — VALACYCLOVIR HCL 1 G PO TABS: 1000.0000 mg | ORAL_TABLET | Freq: Three times a day (TID) | ORAL | 0 refills | Status: DC

## 2018-10-17 NOTE — ED Triage Notes (Signed)
Reports being diagnosed with shingles in October.  Continues to have rashes sporadically.  Reports having a bump in the right ear that had green pus when popped.

## 2018-10-17 NOTE — Discharge Instructions (Addendum)
Start Valtrex (antiviral) three times daily for one week Take Xyzal as directed for itching Please return if you are not improving

## 2018-10-17 NOTE — ED Provider Notes (Signed)
[place] EMERGENCY DEPARTMENT Provider Note   CSN: [id number] Arrival date & time: 10/17/18  1638     History   Chief Complaint Chief Complaint  Patient presents with  . Herpes Zoster    HPI Jennifer Bird is a 23 y.o. female who presents with right ear pain.  Patient states that she was diagnosed with shingles in the ear in October after being seen multiple times in the emergency department for itching and foreign body sensation in the ear.  She states that she started to develop right ear pain overnight and had a lesion on her ear which she had her brother pop and there was green discharge that came out of it.  She states that this is exactly the same symptoms that she had when she was diagnosed with shingles.  She states that she also gets a diffuse rash over her entire body and itching but this is a chronic issue and unrelated to her ear.  She was prescribed Xyzal when she went to [place] and states that this does help but she ran out of this medicine.  She states that she feels like there is popping in her ear and it will move from side to side.  She was prescribed gabapentin for suspected postherpetic neuralgia however does caused her to have mood swings and therefore this was stopped.     HPI  Past Medical History:  Diagnosis Date  . Asthma   . Bronchitis     Patient Active Problem List   Diagnosis Date Noted  . Supervision of normal pregnancy, antepartum 04/18/2016    History reviewed. No pertinent surgical history.   OB History    Gravida  2   Para      Term      Preterm      AB      Living        SAB      TAB      Ectopic      Multiple      Live Births               Home Medications    Prior to Admission medications   Medication Sig Start Date End Date Taking? Authorizing Provider  diphenhydrAMINE (BENADRYL) 25 mg capsule Take 1 capsule (25 mg total) by mouth every 6 (six) hours as needed. Patient not taking:  Reported on 07/23/2016 06/21/16   Nona Dell, PA-C  famotidine (PEPCID) 20 MG tablet Take 1 tablet (20 mg total) by mouth daily for 5 days. 08/22/18 08/27/18  Cuthriell, Charline Bills, PA-C  fluticasone (FLONASE) 50 MCG/ACT nasal spray Place 1 spray into both nostrils 2 (two) times daily. 08/22/18   Cuthriell, Charline Bills, PA-C  gabapentin (NEURONTIN) 300 MG capsule Take 1 capsule (300 mg total) by mouth 3 (three) times daily. Take 1 tablet on day 1, 2 tablets on day 2, 3 tablets on day 3.  Continue dosing of 3 tablets daily 04/23/18 04/23/19  Cuthriell, Charline Bills, PA-C  hydrOXYzine (ATARAX/VISTARIL) 10 MG tablet Take 1 tablet (10 mg total) by mouth every 6 (six) hours as needed for itching. Patient not taking: Reported on 07/23/2016 06/21/16   Nona Dell, PA-C  levocetirizine (XYZAL) 5 MG tablet Take 1 tablet (5 mg total) by mouth daily. 08/22/18   Cuthriell, Charline Bills, PA-C  metroNIDAZOLE (FLAGYL) 500 MG tablet Take 1 tablet (500 mg total) by mouth 2 (two) times daily. 05/25/18   Pattricia Boss, MD  predniSONE (DELTASONE) 50 MG tablet Take 1 tablet (50 mg total) by mouth daily with breakfast. 08/22/18   Cuthriell, Delorise RoyalsJonathan D, PA-C    Family History No family history on file.  Social History Social History   Tobacco Use  . Smoking status: Former Smoker    Types: Cigarettes  . Smokeless tobacco: Never Used  Substance Use Topics  . Alcohol use: No  . Drug use: No     Allergies   Codeine, Other, and Tomato   Review of Systems Review of Systems  Constitutional: Negative for fever.  HENT: Positive for ear pain.   Skin: Positive for rash.     Physical Exam Updated Vital Signs BP 123/70 (BP Location: Right Arm)   Pulse 74   Temp 98.5 F (36.9 C) (Oral)   Resp 16   Ht 5\' 9"  (1.753 m)   Wt 83.9 kg   SpO2 100%   BMI 27.32 kg/m   Physical Exam Vitals signs and nursing note reviewed.  Constitutional:      General: She is not in acute distress.    Appearance: Normal  appearance. She is well-developed. She is not ill-appearing.     Comments: Cooperative  HENT:     Head: Normocephalic and atraumatic.     Right Ear: Hearing, tympanic membrane and ear canal normal. Laceration (Small lesion which appears to be scratched over the auricle) and tenderness (Tragal tenderness) present.     Left Ear: Hearing, tympanic membrane, ear canal and external ear normal.  Eyes:     General: No scleral icterus.       Right eye: No discharge.        Left eye: No discharge.     Conjunctiva/sclera: Conjunctivae normal.     Pupils: Pupils are equal, round, and reactive to light.  Neck:     Musculoskeletal: Normal range of motion.  Cardiovascular:     Rate and Rhythm: Normal rate.  Pulmonary:     Effort: Pulmonary effort is normal. No respiratory distress.  Abdominal:     General: There is no distension.  Skin:    General: Skin is warm and dry.     Comments: Long linear scratch marks scattered on the chest and arms  Neurological:     Mental Status: She is alert and oriented to person, place, and time.  Psychiatric:        Behavior: Behavior normal.      ED Treatments / Results  Labs (all labs ordered are listed, but only abnormal results are displayed) Labs Reviewed - No data to display  EKG    Radiology No results found.  Procedures Procedures (including critical care time)  Medications Ordered in ED Medications - No data to display   Initial Impression / Assessment and Plan / ED Course  I have reviewed the triage vital signs and the nursing notes.  Pertinent labs & imaging results that were available during my care of the patient were reviewed by me and considered in my medical decision making (see chart for details).  23 year old female presents with right ear pain and green discharge after her brother popped a small lesion on the ear earlier today.  Her vitals are normal.  She is calm and cooperative.  While her exam is not entirely consistent  with shingles it is unusual that after once she was diagnosed with this and treated with Valtrex she subjectively improved.  Presentation is somewhat atypical and there is not an obvious rash however there is  a small lesion on the ear.  TM is intact and nonerythematous.  There is no signs of otitis externa or mastoiditis.  Suspect she may have underlying atopic dermatitis as her skin appears dry and I think she is scratching her skin a lot (although denies this).  She states the Xyzal did help her so we will represcribe this and attempt treatment for shingles again. Encouraged establishing with PCP. Return precautions discussed  Final Clinical Impressions(s) / ED Diagnoses   Final diagnoses:  Right ear pain    ED Discharge Orders    None       Bethel BornGekas, Holliday Sheaffer Marie, PA-C 10/17/18 1836    Derwood KaplanNanavati, Ankit, MD 10/18/18 1236

## 2019-03-03 ENCOUNTER — Encounter (HOSPITAL_COMMUNITY): Payer: Self-pay

## 2019-03-03 ENCOUNTER — Other Ambulatory Visit: Payer: Self-pay

## 2019-03-03 ENCOUNTER — Ambulatory Visit (HOSPITAL_COMMUNITY)
Admission: EM | Admit: 2019-03-03 | Discharge: 2019-03-03 | Disposition: A | Discharge status: Home / Self Care | Payer: Self-pay | Attending: Internal Medicine | Admitting: Internal Medicine

## 2019-03-03 DIAGNOSIS — J3489 Other specified disorders of nose and nasal sinuses: Secondary | ICD-10-CM | POA: Insufficient documentation

## 2019-03-03 DIAGNOSIS — R067 Sneezing: Secondary | ICD-10-CM | POA: Insufficient documentation

## 2019-03-03 DIAGNOSIS — R5383 Other fatigue: Secondary | ICD-10-CM | POA: Insufficient documentation

## 2019-03-03 DIAGNOSIS — J45909 Unspecified asthma, uncomplicated: Secondary | ICD-10-CM | POA: Insufficient documentation

## 2019-03-03 DIAGNOSIS — Z87891 Personal history of nicotine dependence: Secondary | ICD-10-CM | POA: Insufficient documentation

## 2019-03-03 DIAGNOSIS — R509 Fever, unspecified: Secondary | ICD-10-CM | POA: Insufficient documentation

## 2019-03-03 DIAGNOSIS — J988 Other specified respiratory disorders: Secondary | ICD-10-CM

## 2019-03-03 DIAGNOSIS — Z79899 Other long term (current) drug therapy: Secondary | ICD-10-CM | POA: Insufficient documentation

## 2019-03-03 DIAGNOSIS — R0981 Nasal congestion: Secondary | ICD-10-CM | POA: Insufficient documentation

## 2019-03-03 DIAGNOSIS — B9789 Other viral agents as the cause of diseases classified elsewhere: Secondary | ICD-10-CM

## 2019-03-03 DIAGNOSIS — Z20828 Contact with and (suspected) exposure to other viral communicable diseases: Secondary | ICD-10-CM | POA: Insufficient documentation

## 2019-03-03 NOTE — ED Triage Notes (Signed)
Patient presents to Urgent Care with complaints of fever since yesterday. Patient reports she does not have a fever today.  Pt endorses headache as well.

## 2019-03-03 NOTE — ED Provider Notes (Signed)
[facility]    CSN: [id number] Arrival date & time: 03/03/19  1611      History   Chief Complaint Chief Complaint  Patient presents with  . Fever    HPI Jennifer Bird is a 23 y.o. female. who presents due to having a fever of 100 yesterday and cold symptoms. It started with intermittent sweats and feeling hot. Has been hydrating herself well, but has not eaten. She cant smell or taste but after taking cold meds is able to taste. She is able to taste the gatoraid.Feels fatigued. Took OTC meds all day yesterday, but today feels worse.  She has a HA on her temples, denies body aches. Has been sneezing off and on for the past 2 days, and not as bad today. Today the stuffiness and rhinitis is worse, and onset of cough was just today. 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     Past Medical History:  Diagnosis Date  . Asthma   . Bronchitis     Patient Active Problem List   Diagnosis Date Noted  . Supervision of normal pregnancy, antepartum 04/18/2016    History reviewed. No pertinent surgical history.  OB History    Gravida  2   Para      Term      Preterm      AB      Living        SAB      TAB      Ectopic      Multiple      Live Births               Home Medications    Prior to Admission medications   Medication Sig Start Date End Date Taking? Authorizing Provider  levocetirizine (XYZAL) 5 MG tablet Take 1 tablet (5 mg total) by mouth every evening. 10/17/18  Yes Bethel BornGekas, Kelly Marie, PA-C  famotidine (PEPCID) 20 MG tablet Take 1 tablet (20 mg total) by mouth daily for 5 days. 08/22/18 08/27/18  Cuthriell, Delorise RoyalsJonathan D, PA-C  fluticasone (FLONASE) 50 MCG/ACT nasal spray Place 1 spray into both nostrils 2 (two) times daily. 08/22/18   Cuthriell, Delorise RoyalsJonathan D, PA-C  gabapentin (NEURONTIN) 300 MG capsule Take 1 capsule (300 mg total) by mouth 3 (three) times daily. Take 1 tablet on day 1, 2 tablets on day 2, 3 tablets on day 3.  Continue dosing of 3 tablets  daily 04/23/18 04/23/19  Cuthriell, Delorise RoyalsJonathan D, PA-C  metroNIDAZOLE (FLAGYL) 500 MG tablet Take 1 tablet (500 mg total) by mouth 2 (two) times daily. 05/25/18   Margarita Grizzleay, Danielle, MD  predniSONE (DELTASONE) 50 MG tablet Take 1 tablet (50 mg total) by mouth daily with breakfast. 08/22/18   Cuthriell, Delorise RoyalsJonathan D, PA-C  valACYclovir (VALTREX) 1000 MG tablet Take 1 tablet (1,000 mg total) by mouth 3 (three) times daily. 10/17/18   Bethel BornGekas, Kelly Marie, PA-C  diphenhydrAMINE (BENADRYL) 25 mg capsule Take 1 capsule (25 mg total) by mouth every 6 (six) hours as needed. Patient not taking: Reported on 07/23/2016 06/21/16 10/17/18  Barrett HenleNadeau, Nicole Elizabeth, PA-C    Family History Family History  Problem Relation Age of Onset  . Hypertension Mother   . Healthy Father     Social History Social History   Tobacco Use  . Smoking status: Former Smoker    Types: Cigarettes  . Smokeless tobacco: Never Used  Substance Use Topics  . Alcohol use: Yes    Comment: occ  . Drug use:  No     Allergies   Codeine, Other, and Tomato   Review of Systems Review of Systems  Constitutional: Positive for fever. Negative for chills and fatigue.  HENT: Positive for congestion and rhinorrhea. Negative for ear discharge, sore throat and trouble swallowing.   Respiratory: Positive for cough. Negative for shortness of breath and wheezing.      Physical Exam Triage Vital Signs ED Triage Vitals  Enc Vitals Group     BP 03/03/19 1641 121/73     Pulse Rate 03/03/19 1641 73     Resp 03/03/19 1641 16     Temp 03/03/19 1641 98.8 F (37.1 C)     Temp Source 03/03/19 1641 Oral     SpO2 03/03/19 1641 99 %     Weight --      Height --      Head Circumference --      Peak Flow --      Pain Score 03/03/19 1638 10     Pain Loc --      Pain Edu? --      Excl. in GC? --    No data found.  Updated Vital Signs BP 121/73 (BP Location: Right Arm)   Pulse 73   Temp 98.8 F (37.1 C) (Oral)   Resp 16   SpO2 99%   Visual  Acuity Right Eye Distance:   Left Eye Distance:   Bilateral Distance:    Right Eye Near:   Left Eye Near:    Bilateral Near:     Physical Exam Vitals signs and nursing note reviewed.  Constitutional:      General: She is not in acute distress.    Appearance: She is obese. She is not ill-appearing or toxic-appearing.  HENT:     Right Ear: Tympanic membrane, ear canal and external ear normal.     Left Ear: Tympanic membrane, ear canal and external ear normal.     Nose: Congestion present.     Mouth/Throat:     Mouth: Mucous membranes are moist.  Eyes:     General: No scleral icterus.    Extraocular Movements: Extraocular movements intact.     Conjunctiva/sclera: Conjunctivae normal.  Neck:     Musculoskeletal: Neck supple. No neck rigidity.  Cardiovascular:     Rate and Rhythm: Normal rate and regular rhythm.     Heart sounds: No murmur.  Pulmonary:     Effort: Pulmonary effort is normal.     Breath sounds: Normal breath sounds.  Musculoskeletal: Normal range of motion.  Lymphadenopathy:     Cervical: No cervical adenopathy.  Skin:    General: Skin is warm and dry.     Findings: No rash.  Neurological:     Mental Status: She is alert and oriented to person, place, and time.  Psychiatric:        Mood and Affect: Mood normal.        Behavior: Behavior normal.        Thought Content: Thought content normal.        Judgment: Judgment normal.    UC Treatments / Results  Labs (all labs ordered are listed, but only abnormal results are displayed) Labs Reviewed  NOVEL CORONAVIRUS, NAA (HOSP ORDER, SEND-OUT TO REF LAB; TAT 18-24 HRS)    EKG   Radiology No results found.  Procedures Procedures (including critical care time)  Medications Ordered in UC Medications - No data to display  Initial Impression / Assessment and Plan /  UC Course  I have reviewed the triage vital signs and the nursing notes. I showed her the Netie Pot rinse video and advised her to do  this bid x 3-5 days.  May continue current  Cold meds or may take Advil cold for her sinus headache.  She was also advised to take while with viral illness, Zinc 50mg /d, Vit 5000 IU/d, and Vit C 1000 mg qd.  Needs to stay quarantined until her results come back  Final Clinical Impressions(s) / UC Diagnoses   Final diagnoses:  None   Discharge Instructions   None    ED Prescriptions    None     PDMP not reviewed this encounter.   Shelby Mattocks, PA-C 03/03/19 1727

## 2019-03-03 NOTE — Discharge Instructions (Signed)
Do the saline nose rinses twice a day, but not before bed time. Wait at least 3 hours before bed time.  You may continue the cold meds you are taking right now, or take Advil sinus which will help with your sinus headache.  Do not return to work until you have heard from your Covid test results and if you dont have a fever for 24 hours if it was negative.

## 2019-03-05 LAB — NOVEL CORONAVIRUS, NAA (HOSP ORDER, SEND-OUT TO REF LAB; TAT 18-24 HRS): SARS-CoV-2, NAA: NOT DETECTED

## 2019-04-06 ENCOUNTER — Other Ambulatory Visit: Payer: Self-pay

## 2019-04-06 ENCOUNTER — Encounter (HOSPITAL_COMMUNITY): Payer: Self-pay | Admitting: Emergency Medicine

## 2019-04-06 ENCOUNTER — Emergency Department (HOSPITAL_COMMUNITY)
Admission: EM | Admit: 2019-04-06 | Discharge: 2019-04-06 | Disposition: A | Discharge status: Home / Self Care | Payer: Self-pay | Attending: Emergency Medicine | Admitting: Emergency Medicine

## 2019-04-06 DIAGNOSIS — J45909 Unspecified asthma, uncomplicated: Secondary | ICD-10-CM | POA: Insufficient documentation

## 2019-04-06 DIAGNOSIS — Z79899 Other long term (current) drug therapy: Secondary | ICD-10-CM | POA: Insufficient documentation

## 2019-04-06 DIAGNOSIS — R002 Palpitations: Secondary | ICD-10-CM | POA: Insufficient documentation

## 2019-04-06 DIAGNOSIS — R251 Tremor, unspecified: Secondary | ICD-10-CM | POA: Insufficient documentation

## 2019-04-06 DIAGNOSIS — F419 Anxiety disorder, unspecified: Secondary | ICD-10-CM | POA: Insufficient documentation

## 2019-04-06 DIAGNOSIS — R0602 Shortness of breath: Secondary | ICD-10-CM | POA: Insufficient documentation

## 2019-04-06 DIAGNOSIS — Z87891 Personal history of nicotine dependence: Secondary | ICD-10-CM | POA: Insufficient documentation

## 2019-04-06 DIAGNOSIS — R079 Chest pain, unspecified: Secondary | ICD-10-CM | POA: Insufficient documentation

## 2019-04-06 MED ORDER — HYDROXYZINE HCL 25 MG PO TABS: 25.0000 mg | ORAL_TABLET | Freq: Four times a day (QID) | ORAL | 0 refills | Status: DC

## 2019-04-06 NOTE — Discharge Instructions (Addendum)
You were seen in the ER for palpitations.  EKG was normal.  I suspect your symptoms today were due to stress response from your dream.  Hydroxyzine is a medicine that you can take as needed for any breakthrough anxiety type symptoms.  Ideally, your primary care doctor will do a formal evaluation determined/diagnosis behalf generalized anxiety disorder or panic disorder.  You may need different medicines to decrease your overall level of anxiety.  These medicines need to be started by your primary care doctor and monitor closely and increase dose as needed.  Return to the ER for worsening symptoms, chest pain shortness of breath palpitations or feeling faint with exertion

## 2019-04-06 NOTE — ED Provider Notes (Signed)
[facility] EMERGENCY DEPARTMENT Provider Note   CSN: [id number] Arrival date & time: 04/06/19  3474     History Chief Complaint  Patient presents with  . Anxiety    Jennifer Bird is a 23 y.o. female with history of shingles and postherpetic neuralgia, anxiety presents to the ER for evaluation of palpitations onset around 630 am today.  She attributes this to a bad dream that she had.  Remembers feeling like she was awake during the dream and trying to wake up but could not eventually, jumped out and woke herself up.  She had sudden onset, transient heart racing sensation, chest discomfort, generalized tremors, shortness of breath.  Reports ever since being diagnosed with shingles she has had increased anxiety.  Actually has had very similar symptoms in the past including palpitations, chest discomfort and shortness of breath.  She currently is asymptomatic and denies any symptoms.  She is not on any medicines for anxiety.  Denies history of abnormal cardiac rhythms, heart disease, blood clots.  Denies illicit drug use.  Denies recent illnesses or infectious symptoms like fever, cough, vomiting or diarrhea.  Denies recent increase in caffeine, changes in sleep or medicines. She is hoping to have something to help her with her anxiety.  No interventions. No modifying factors.   HPI     Past Medical History:  Diagnosis Date  . Asthma   . Bronchitis     Patient Active Problem List   Diagnosis Date Noted  . Supervision of normal pregnancy, antepartum 04/18/2016    No past surgical history on file.   OB History    Gravida  2   Para      Term      Preterm      AB      Living        SAB      TAB      Ectopic      Multiple      Live Births              Family History  Problem Relation Age of Onset  . Hypertension Mother   . Healthy Father     Social History   Tobacco Use  . Smoking status: Former Smoker    Types: Cigarettes  .  Smokeless tobacco: Never Used  Substance Use Topics  . Alcohol use: Yes    Comment: occ  . Drug use: No    Home Medications Prior to Admission medications   Medication Sig Start Date End Date Taking? Authorizing Provider  famotidine (PEPCID) 20 MG tablet Take 1 tablet (20 mg total) by mouth daily for 5 days. 08/22/18 08/27/18  Cuthriell, Delorise Royals, PA-C  fluticasone (FLONASE) 50 MCG/ACT nasal spray Place 1 spray into both nostrils 2 (two) times daily. 08/22/18   Cuthriell, Delorise Royals, PA-C  gabapentin (NEURONTIN) 300 MG capsule Take 1 capsule (300 mg total) by mouth 3 (three) times daily. Take 1 tablet on day 1, 2 tablets on day 2, 3 tablets on day 3.  Continue dosing of 3 tablets daily 04/23/18 04/23/19  Cuthriell, Delorise Royals, PA-C  hydrOXYzine (ATARAX/VISTARIL) 25 MG tablet Take 1 tablet (25 mg total) by mouth every 6 (six) hours. 04/06/19   Liberty Handy, PA-C  levocetirizine (XYZAL) 5 MG tablet Take 1 tablet (5 mg total) by mouth every evening. 10/17/18   Bethel Born, PA-C  valACYclovir (VALTREX) 1000 MG tablet Take 1 tablet (1,000 mg total) by mouth 3 (  three) times daily. 10/17/18   Bethel BornGekas, Kelly Marie, PA-C  diphenhydrAMINE (BENADRYL) 25 mg capsule Take 1 capsule (25 mg total) by mouth every 6 (six) hours as needed. Patient not taking: Reported on 07/23/2016 06/21/16 10/17/18  Barrett HenleNadeau, Nicole Elizabeth, PA-C    Allergies    Codeine, Other, and Tomato  Review of Systems   Review of Systems  Respiratory: Positive for shortness of breath (resolved).   Cardiovascular: Positive for chest pain (resolved) and palpitations (resolved).  All other systems reviewed and are negative.   Physical Exam Updated Vital Signs BP 103/68 (BP Location: Right Arm)   Pulse 86   Temp 98.4 F (36.9 C) (Oral)   Resp 16   Ht 5\' 11"  (1.803 m)   Wt 83 kg   SpO2 97%   BMI 25.52 kg/m   Physical Exam Vitals and nursing note reviewed.  Constitutional:      General: She is not in acute distress.     Appearance: She is well-developed.     Comments: NAD.  HENT:     Head: Normocephalic and atraumatic.     Right Ear: External ear normal.     Left Ear: External ear normal.     Nose: Nose normal.  Eyes:     General: No scleral icterus.    Conjunctiva/sclera: Conjunctivae normal.  Cardiovascular:     Rate and Rhythm: Normal rate and regular rhythm.     Heart sounds: Normal heart sounds. No murmur.     Comments: 1+ radial and DP pulses bilaterally. No LE edema or calf tenderness  Pulmonary:     Effort: Pulmonary effort is normal.     Breath sounds: Normal breath sounds. No wheezing.  Musculoskeletal:        General: No deformity. Normal range of motion.     Cervical back: Normal range of motion and neck supple.  Skin:    General: Skin is warm and dry.     Capillary Refill: Capillary refill takes less than 2 seconds.  Neurological:     Mental Status: She is alert and oriented to person, place, and time.  Psychiatric:        Behavior: Behavior normal.        Thought Content: Thought content normal.        Judgment: Judgment normal.     ED Results / Procedures / Treatments   Labs (all labs ordered are listed, but only abnormal results are displayed) Labs Reviewed - No data to display  EKG None  Radiology No results found.  Procedures Procedures (including critical care time)  Medications Ordered in ED Medications - No data to display  ED Course  I have reviewed the triage vital signs and the nursing notes.  Pertinent labs & imaging results that were available during my care of the patient were reviewed by me and considered in my medical decision making (see chart for details).    MDM Rules/Calculators/A&P       Ddx includes stress/nightmare response palpitations.    She has history of same with similar symptoms in the past.  Not on any medicines for anxiety or panic.    Considered life threatening, symptomatic anemia, or cardiac etiology but highly unlikely in  this young patient with similar symptoms in the past.  No long having CP, SOB. PERC negative and PE unlikely. EKG without arrhythmias.  HD stable. Currently asymptomatic.   Will dc with hydroxizine and PCP f/u for med changes as needed for anxiety. Return precautions  given. Patient comfortable with plan.   Final Clinical Impression(s) / ED Diagnoses Final diagnoses:  Palpitations    Rx / DC Orders ED Discharge Orders         Ordered    hydrOXYzine (ATARAX/VISTARIL) 25 MG tablet  Every 6 hours     04/06/19 0937           Kinnie Feil, PA-C 04/06/19 6384    Charlesetta Shanks, MD 04/07/19 1048

## 2019-04-06 NOTE — ED Notes (Signed)
Pt discharge instructions and prescription reviewed with the patient. The patient verbalized understanding of both. Pt discharged. 

## 2019-04-06 NOTE — ED Triage Notes (Signed)
Pt coming from home today. Complaint of anxiety issue. Pt states she was asleep and woke up suddenly and pt states she has felt like her heart has been racing ever since. Pt states she woke up @630  this morning.

## 2019-07-15 ENCOUNTER — Ambulatory Visit (HOSPITAL_COMMUNITY)
Admission: EM | Admit: 2019-07-15 | Discharge: 2019-07-15 | Disposition: A | Discharge status: Home / Self Care | Payer: Self-pay | Attending: Family Medicine | Admitting: Family Medicine

## 2019-07-15 ENCOUNTER — Other Ambulatory Visit: Payer: Self-pay

## 2019-07-15 ENCOUNTER — Encounter (HOSPITAL_COMMUNITY): Payer: Self-pay

## 2019-07-15 DIAGNOSIS — S161XXA Strain of muscle, fascia and tendon at neck level, initial encounter: Secondary | ICD-10-CM

## 2019-07-15 MED ORDER — CYCLOBENZAPRINE HCL 10 MG PO TABS: ORAL_TABLET | ORAL | 0 refills | Status: DC

## 2019-07-15 MED ORDER — DICLOFENAC SODIUM 75 MG PO TBEC: 75.0000 mg | DELAYED_RELEASE_TABLET | Freq: Two times a day (BID) | ORAL | 0 refills | Status: DC
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
## 2019-09-30 IMAGING — DX DG CHEST 2V
2 series · 2 of 2 positions shown · non-contrast
Comparison: 05/22/2018 chest radiograph

CLINICAL DATA: 22 y/o  F; chest pain.

EXAM:
CHEST - 2 VIEW

[chest pa]
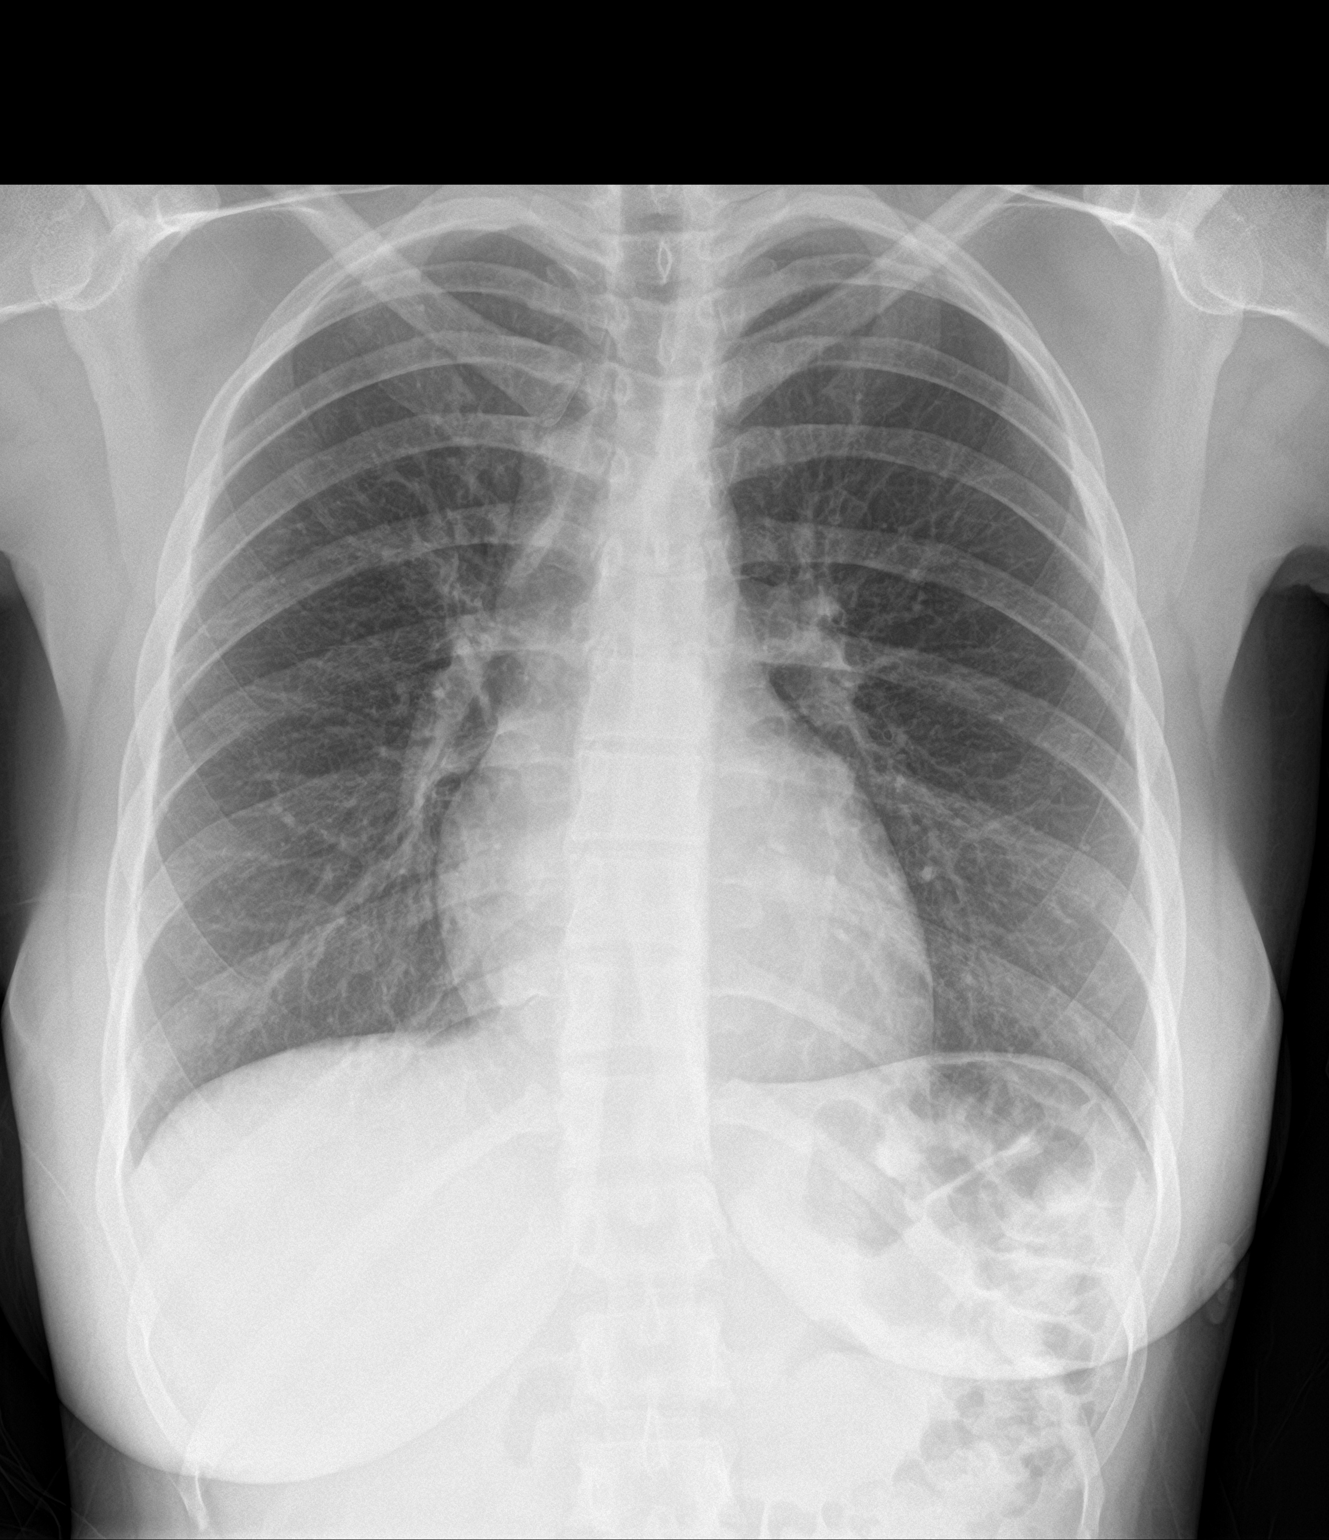

[chest lat]
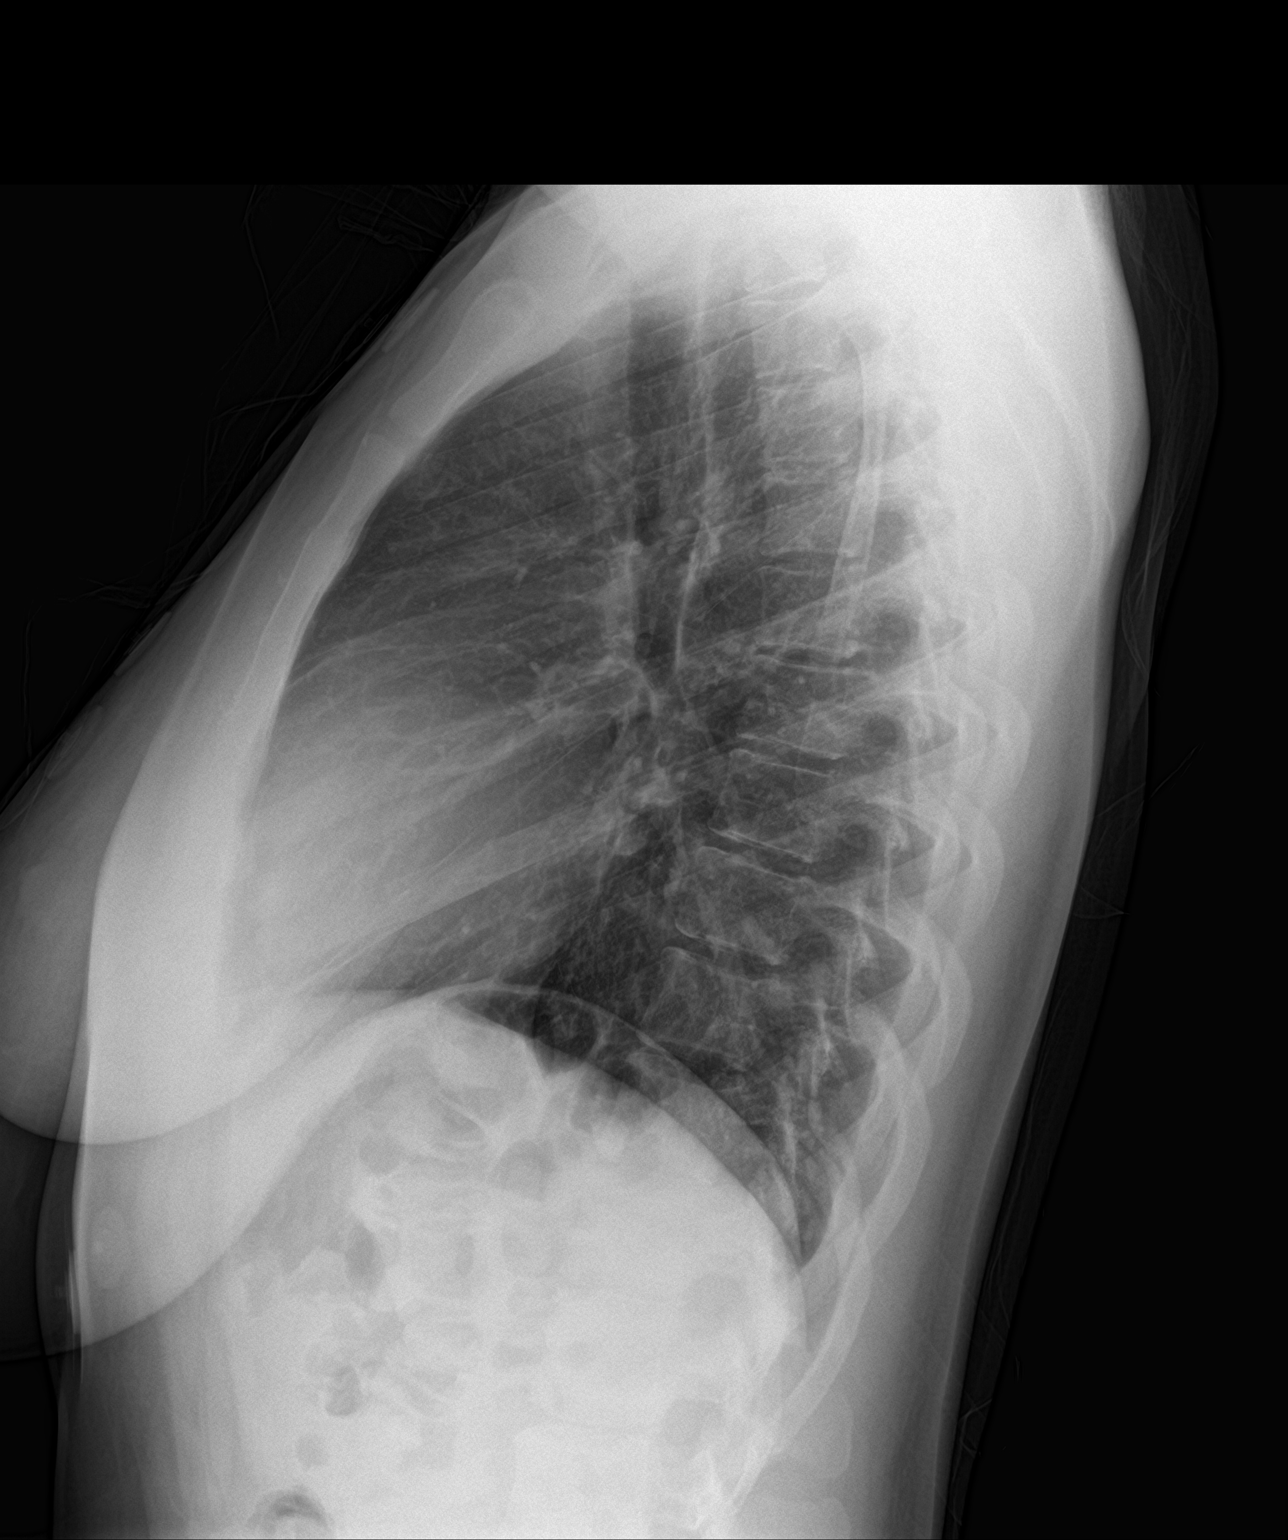

[2 of 2 positions shown; findings below may reference images not displayed]

FINDINGS: Stable heart size and mediastinal contours are within normal limits.
Both lungs are clear. The visualized skeletal structures are
unremarkable.
IMPRESSION: No acute pulmonary process identified.

## 2019-10-09 ENCOUNTER — Ambulatory Visit (HOSPITAL_COMMUNITY): Admission: EM | Admit: 2019-10-09 | Discharge: 2019-10-09 | Discharge status: Against Medical Advice | Payer: Self-pay

## 2019-10-09 ENCOUNTER — Other Ambulatory Visit: Payer: Self-pay

## 2020-05-03 ENCOUNTER — Encounter (HOSPITAL_COMMUNITY): Payer: Self-pay

## 2020-05-03 ENCOUNTER — Other Ambulatory Visit: Payer: Self-pay

## 2020-05-03 ENCOUNTER — Ambulatory Visit (HOSPITAL_COMMUNITY)
Admission: EM | Admit: 2020-05-03 | Discharge: 2020-05-03 | Disposition: A | Discharge status: Home / Self Care | Payer: Self-pay | Attending: Student | Admitting: Student

## 2020-05-03 DIAGNOSIS — J45909 Unspecified asthma, uncomplicated: Secondary | ICD-10-CM | POA: Insufficient documentation

## 2020-05-03 DIAGNOSIS — Z113 Encounter for screening for infections with a predominantly sexual mode of transmission: Secondary | ICD-10-CM | POA: Insufficient documentation

## 2020-05-03 DIAGNOSIS — Z20822 Contact with and (suspected) exposure to covid-19: Secondary | ICD-10-CM | POA: Insufficient documentation

## 2020-05-03 DIAGNOSIS — J3089 Other allergic rhinitis: Secondary | ICD-10-CM | POA: Insufficient documentation

## 2020-05-03 DIAGNOSIS — Z79899 Other long term (current) drug therapy: Secondary | ICD-10-CM | POA: Insufficient documentation

## 2020-05-03 DIAGNOSIS — Z87891 Personal history of nicotine dependence: Secondary | ICD-10-CM | POA: Insufficient documentation

## 2020-05-03 LAB — HIV ANTIBODY (ROUTINE TESTING W REFLEX): HIV Screen 4th Generation wRfx: NONREACTIVE

## 2020-05-03 LAB — SARS CORONAVIRUS 2 (TAT 6-24 HRS): SARS Coronavirus 2: NEGATIVE

## 2020-05-03 MED ORDER — FLUTICASONE PROPIONATE 50 MCG/ACT NA SUSP: 1.0000 | Freq: Every day | NASAL | 2 refills | Status: DC

## 2020-05-03 MED ORDER — OLOPATADINE HCL 0.1 % OP SOLN: 1.0000 [drp] | Freq: Two times a day (BID) | OPHTHALMIC | 2 refills | Status: DC

## 2020-05-03 MED ORDER — CETIRIZINE HCL 10 MG PO TABS: 10.0000 mg | ORAL_TABLET | Freq: Every day | ORAL | 1 refills | Status: DC

## 2020-05-03 NOTE — ED Provider Notes (Signed)
[facility]    CSN: [id number] Arrival date & time: 05/03/20  0805      History   Chief Complaint Chief Complaint  Patient presents with  . Nasal Congestion  . STD testing    HPI Jennifer Bird is a 25 y.o. female Presenting for URI symptoms for 2 days. History of asthma and bronchitis, controlled on no inhalers. Today endorses occ sneezing and watery eyes- states her allergies are acting up. Taking nyquil with some relief. Denies fevers/chills, n/v/d, shortness of breath, chest pain, cough, congestion, facial pain, teeth pain, headaches, sore throat, loss of taste/smell, swollen lymph nodes, ear pain. Denies chest pain, shortness of breath, confusion, high fevers.   Also requesting STI screening. Denies symptoms- states that she's interested in screening since the's here for her other symptoms. Denies hematuria, dysuria, frequency, urgency, back pain, n/v/d/abd pain, fevers/chills, abdnormal vaginal discharge, vaginal lesions, new partners, etc.    HPI  Past Medical History:  Diagnosis Date  . Asthma   . Bronchitis     Patient Active Problem List   Diagnosis Date Noted  . Supervision of normal pregnancy, antepartum 04/18/2016    History reviewed. No pertinent surgical history.  OB History    Gravida  2   Para      Term      Preterm      AB      Living        SAB      IAB      Ectopic      Multiple      Live Births               Home Medications    Prior to Admission medications   Medication Sig Start Date End Date Taking? Authorizing Provider  cetirizine (ZYRTEC ALLERGY) 10 MG tablet Take 1 tablet (10 mg total) by mouth daily. 05/03/20  Yes Rhys Martini, PA-C  fluticasone (FLONASE) 50 MCG/ACT nasal spray Place 1 spray into both nostrils daily. 05/03/20  Yes Rhys Martini, PA-C  olopatadine (PATADAY) 0.1 % ophthalmic solution Place 1 drop into both eyes 2 (two) times daily. 05/03/20  Yes Rhys Martini, PA-C  cyclobenzaprine  (FLEXERIL) 10 MG tablet Take 1 tablet by mouth 3 times daily as needed for muscle spasm. Warning: May cause drowsiness. 07/15/19   Mardella Layman, MD  diclofenac (VOLTAREN) 75 MG EC tablet Take 1 tablet (75 mg total) by mouth 2 (two) times daily. 07/15/19   Mardella Layman, MD  famotidine (PEPCID) 20 MG tablet Take 1 tablet (20 mg total) by mouth daily for 5 days. 08/22/18 08/27/18  Cuthriell, Delorise Royals, PA-C  gabapentin (NEURONTIN) 300 MG capsule Take 1 capsule (300 mg total) by mouth 3 (three) times daily. Take 1 tablet on day 1, 2 tablets on day 2, 3 tablets on day 3.  Continue dosing of 3 tablets daily 04/23/18 04/23/19  Cuthriell, Delorise Royals, PA-C  hydrOXYzine (ATARAX/VISTARIL) 25 MG tablet Take 1 tablet (25 mg total) by mouth every 6 (six) hours. 04/06/19   Liberty Handy, PA-C  levocetirizine (XYZAL) 5 MG tablet Take 1 tablet (5 mg total) by mouth every evening. 10/17/18   Bethel Born, PA-C  valACYclovir (VALTREX) 1000 MG tablet Take 1 tablet (1,000 mg total) by mouth 3 (three) times daily. 10/17/18   Bethel Born, PA-C  diphenhydrAMINE (BENADRYL) 25 mg capsule Take 1 capsule (25 mg total) by mouth every 6 (six) hours as needed. Patient not taking:  Reported on 07/23/2016 06/21/16 10/17/18  Barrett Henle, PA-C    Family History Family History  Problem Relation Age of Onset  . Hypertension Mother   . Healthy Father     Social History Social History   Tobacco Use  . Smoking status: Former Smoker    Types: Cigarettes  . Smokeless tobacco: Never Used  Vaping Use  . Vaping Use: Never used  Substance Use Topics  . Alcohol use: Yes    Comment: occ  . Drug use: No     Allergies   Codeine, Other, and Tomato   Review of Systems Review of Systems  Constitutional: Negative for appetite change, chills and fever.  HENT: Negative for congestion, ear pain, rhinorrhea, sinus pressure, sinus pain and sore throat.   Eyes: Positive for itching. Negative for photophobia, pain,  discharge, redness and visual disturbance.       Watery eyes  Respiratory: Negative for cough, chest tightness, shortness of breath and wheezing.        Sneezing  Cardiovascular: Negative for chest pain and palpitations.  Gastrointestinal: Negative for abdominal pain, constipation, diarrhea, nausea and vomiting.  Genitourinary: Negative for decreased urine volume, difficulty urinating, dysuria, flank pain, frequency, genital sores, hematuria and urgency.  Musculoskeletal: Negative for back pain and myalgias.  Skin: Negative for rash.  Neurological: Negative for dizziness, weakness and headaches.  Psychiatric/Behavioral: Negative for confusion.  All other systems reviewed and are negative.    Physical Exam Triage Vital Signs ED Triage Vitals  Enc Vitals Group     BP      Pulse      Resp      Temp      Temp src      SpO2      Weight      Height      Head Circumference      Peak Flow      Pain Score      Pain Loc      Pain Edu?      Excl. in GC?    No data found.  Updated Vital Signs BP 118/76 (BP Location: Left Arm)   Pulse 80   Temp 98 F (36.7 C) (Oral)   Resp 19   LMP  (Within Weeks) Comment: 2 weeks  SpO2 100%   Visual Acuity Right Eye Distance:   Left Eye Distance:   Bilateral Distance:    Right Eye Near:   Left Eye Near:    Bilateral Near:     Physical Exam Vitals reviewed.  Constitutional:      General: She is not in acute distress.    Appearance: Normal appearance. She is not ill-appearing.  HENT:     Head: Normocephalic and atraumatic.     Right Ear: Hearing, tympanic membrane, ear canal and external ear normal. No swelling or tenderness. There is no impacted cerumen. No mastoid tenderness. Tympanic membrane is not perforated, erythematous, retracted or bulging.     Left Ear: Hearing, tympanic membrane, ear canal and external ear normal. No swelling or tenderness. There is no impacted cerumen. No mastoid tenderness. Tympanic membrane is not  perforated, erythematous, retracted or bulging.     Nose:     Right Sinus: No maxillary sinus tenderness or frontal sinus tenderness.     Left Sinus: No maxillary sinus tenderness or frontal sinus tenderness.     Mouth/Throat:     Mouth: Mucous membranes are moist.     Pharynx: Uvula midline. No oropharyngeal  exudate or posterior oropharyngeal erythema.     Tonsils: No tonsillar exudate.  Cardiovascular:     Rate and Rhythm: Normal rate and regular rhythm.     Heart sounds: Normal heart sounds.  Pulmonary:     Effort: Pulmonary effort is normal.     Breath sounds: Normal breath sounds and air entry. No wheezing, rhonchi or rales.  Chest:     Chest wall: No tenderness.  Abdominal:     General: Abdomen is flat. Bowel sounds are normal. There is no distension.     Palpations: Abdomen is soft. There is no mass.     Tenderness: There is no abdominal tenderness. There is no right CVA tenderness, left CVA tenderness, guarding or rebound.     Comments: Bowel sounds positive in all 4 quadrants. No tenderness to palpation. Negative Murphy Sign, Rovsing's sign, McBurney point tenderness.   Lymphadenopathy:     Cervical: No cervical adenopathy.  Neurological:     General: No focal deficit present.     Mental Status: She is alert and oriented to person, place, and time.  Psychiatric:        Attention and Perception: Attention and perception normal.        Mood and Affect: Mood and affect normal.        Behavior: Behavior normal. Behavior is cooperative.        Thought Content: Thought content normal.        Judgment: Judgment normal.      UC Treatments / Results  Labs (all labs ordered are listed, but only abnormal results are displayed) Labs Reviewed  SARS CORONAVIRUS 2 (TAT 6-24 HRS)  HIV ANTIBODY (ROUTINE TESTING W REFLEX)  RPR  CERVICOVAGINAL ANCILLARY ONLY    EKG   Radiology No results found.  Procedures Procedures (including critical care time)  Medications Ordered in  UC Medications - No data to display  Initial Impression / Assessment and Plan / UC Course  I have reviewed the triage vital signs and the nursing notes.  Pertinent labs & imaging results that were available during my care of the patient were reviewed by me and considered in my medical decision making (see chart for details).     Covid test sent today. Isolation precautions per CDC guidelines until negative result. Symptomatic relief with OTC Mucinex, Nyquil, etc. Return precautions- new/worsening fevers/chills, shortness of breath, chest pain, abd pain, etc. Zyrtec, Olopatadine, Flonase as below.  We have sent testing for sexually transmitted infections. We will notify you of any positive results once they are received. If required, we will prescribe any medications you might need.   Please refrain from all sexual activity for at least the next seven days.  Seek additional medical attention if you develop fevers/chills, new/worsening abdominal pain, new/worsening vaginal discomfort/discharge, etc. Patient verbalizes understanding and agreement.  Final Clinical Impressions(s) / UC Diagnoses   Final diagnoses:  Seasonal allergic rhinitis due to other allergic trigger  Routine screening for STI (sexually transmitted infection)     Discharge Instructions     -Zyrtec once daily for allergies -Eye drops 1-2x daily for allergic conjunctivitis -Flonase nasal saline for congestion/allergies -Continue Nyquil as needed  We are currently awaiting result of your PCR covid-19 test. This typically comes back in 1-2 days. We'll call you if the result is positive. Otherwise, the result will be sent electronically to your MyChart. You can also call this clinic and ask for your result via telephone.   Please isolate at home while  awaiting these results. If your test is positive for Covid-19, continue to isolate at home for 5 days if you have mild symptoms, or a total of 10 days from symptom onset if  you have more severe symptoms. If you quarantine for a shorter period of time (i.e. 5 days), make sure to wear a mask until day 10 of symptoms. Treat your symptoms at home with OTC remedies like tylenol/ibuprofen, mucinex, nyquil, etc. Seek medical attention if you develop high fevers, chest pain, shortness of breath, ear pain, facial pain, etc. Make sure to get up and move around every 2-3 hours while convalescing to help prevent blood clots. Drink plenty of fluids, and rest as much as possible.     ED Prescriptions    Medication Sig Dispense Auth. Provider   fluticasone (FLONASE) 50 MCG/ACT nasal spray Place 1 spray into both nostrils daily. 15 mL Rhys MartiniGraham, Halley Kincer E, PA-C   cetirizine (ZYRTEC ALLERGY) 10 MG tablet Take 1 tablet (10 mg total) by mouth daily. 30 tablet Rhys MartiniGraham, Jeovani Weisenburger E, PA-C   olopatadine (PATADAY) 0.1 % ophthalmic solution Place 1 drop into both eyes 2 (two) times daily. 5 mL Rhys MartiniGraham, Daquavion Catala E, PA-C     PDMP not reviewed this encounter.   Rhys MartiniGraham, Nesreen Albano E, PA-C 05/03/20 [phone]

## 2020-05-03 NOTE — Discharge Instructions (Signed)
-  Zyrtec once daily for allergies -Eye drops 1-2x daily for allergic conjunctivitis -Flonase nasal saline for congestion/allergies -Continue Nyquil as needed  We are currently awaiting result of your PCR covid-19 test. This typically comes back in 1-2 days. We'll call you if the result is positive. Otherwise, the result will be sent electronically to your MyChart. You can also call this clinic and ask for your result via telephone.   Please isolate at home while awaiting these results. If your test is positive for Covid-19, continue to isolate at home for 5 days if you have mild symptoms, or a total of 10 days from symptom onset if you have more severe symptoms. If you quarantine for a shorter period of time (i.e. 5 days), make sure to wear a mask until day 10 of symptoms. Treat your symptoms at home with OTC remedies like tylenol/ibuprofen, mucinex, nyquil, etc. Seek medical attention if you develop high fevers, chest pain, shortness of breath, ear pain, facial pain, etc. Make sure to get up and move around every 2-3 hours while convalescing to help prevent blood clots. Drink plenty of fluids, and rest as much as possible.

## 2020-05-03 NOTE — ED Triage Notes (Signed)
Pt reports sneezing and nasal congestion  x 4 days. Pt requested COVID test.  Pt requested STD's testing. Denies dysuria, vaginal discharge.

## 2020-05-04 LAB — RPR: RPR Ser Ql: NONREACTIVE

## 2020-05-05 LAB — CERVICOVAGINAL ANCILLARY ONLY
Bacterial Vaginitis (gardnerella): POSITIVE — AB
Candida Glabrata: NEGATIVE
Candida Vaginitis: NEGATIVE
Chlamydia: NEGATIVE
Comment: NEGATIVE
Comment: NEGATIVE
Comment: NEGATIVE
Comment: NEGATIVE
Comment: NEGATIVE
Comment: NORMAL
Neisseria Gonorrhea: NEGATIVE
Trichomonas: NEGATIVE

## 2020-05-06 ENCOUNTER — Telehealth: Payer: Self-pay

## 2020-05-06 DIAGNOSIS — B9689 Other specified bacterial agents as the cause of diseases classified elsewhere: Secondary | ICD-10-CM

## 2020-05-06 MED ORDER — METRONIDAZOLE 500 MG PO TABS: 500.0000 mg | ORAL_TABLET | Freq: Two times a day (BID) | ORAL | 0 refills | Status: DC

## 2020-05-06 NOTE — Telephone Encounter (Signed)
Bacterial vaginosis is positive. Pt needs treatment. Flagyl 500 mg BID x 7 days #14 no refills sent to patients pharmacy of choice.   

## 2020-11-11 ENCOUNTER — Emergency Department: Admission: EM | Admit: 2020-11-11 | Discharge: 2020-11-11 | Discharge status: Absent without leave | Payer: Self-pay

## 2021-01-23 ENCOUNTER — Emergency Department (HOSPITAL_COMMUNITY)
Admission: EM | Admit: 2021-01-23 | Discharge: 2021-01-23 | Disposition: A | Discharge status: Home / Self Care | Payer: Medicaid Other | Attending: Emergency Medicine | Admitting: Emergency Medicine

## 2021-01-23 ENCOUNTER — Other Ambulatory Visit: Payer: Self-pay

## 2021-01-23 ENCOUNTER — Emergency Department (HOSPITAL_COMMUNITY): Payer: Medicaid Other

## 2021-01-23 ENCOUNTER — Encounter (HOSPITAL_COMMUNITY): Payer: Self-pay | Admitting: *Deleted

## 2021-01-23 DIAGNOSIS — W01198A Fall on same level from slipping, tripping and stumbling with subsequent striking against other object, initial encounter: Secondary | ICD-10-CM | POA: Diagnosis not present

## 2021-01-23 DIAGNOSIS — R112 Nausea with vomiting, unspecified: Secondary | ICD-10-CM | POA: Insufficient documentation

## 2021-01-23 DIAGNOSIS — Z5321 Procedure and treatment not carried out due to patient leaving prior to being seen by health care provider: Secondary | ICD-10-CM | POA: Insufficient documentation

## 2021-01-23 DIAGNOSIS — M542 Cervicalgia: Secondary | ICD-10-CM | POA: Diagnosis not present

## 2021-01-23 DIAGNOSIS — S0093XA Contusion of unspecified part of head, initial encounter: Secondary | ICD-10-CM | POA: Insufficient documentation

## 2021-01-23 DIAGNOSIS — R55 Syncope and collapse: Secondary | ICD-10-CM | POA: Insufficient documentation

## 2021-01-23 LAB — I-STAT BETA HCG BLOOD, ED (MC, WL, AP ONLY): I-stat hCG, quantitative: <5 m[IU]/mL (ref <5)

## 2021-01-23 NOTE — ED Notes (Signed)
Pt refused vitals 
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
## 2021-01-23 NOTE — ED Notes (Addendum)
Pt visibly upset and walked out of lobby stating she was leaving due to wait time.

## 2021-02-27 ENCOUNTER — Other Ambulatory Visit: Payer: Self-pay

## 2021-02-27 ENCOUNTER — Encounter: Payer: Self-pay | Admitting: Family Medicine

## 2021-02-27 ENCOUNTER — Ambulatory Visit (INDEPENDENT_AMBULATORY_CARE_PROVIDER_SITE_OTHER): Payer: Self-pay | Admitting: Obstetrics and Gynecology

## 2021-02-27 VITALS — BP 108/64 | HR 72 | Ht 69.0 in | Wt 202.5 lb

## 2021-02-27 DIAGNOSIS — Z3490 Encounter for supervision of normal pregnancy, unspecified, unspecified trimester: Secondary | ICD-10-CM

## 2021-02-27 DIAGNOSIS — Z3201 Encounter for pregnancy test, result positive: Secondary | ICD-10-CM

## 2021-02-27 LAB — POCT PREGNANCY, URINE: Preg Test, Ur: POSITIVE — AB

## 2021-02-27 MED ORDER — PRENATAL VITAMIN AND MINERAL 28-0.8 MG PO TABS: 1.0000 | ORAL_TABLET | Freq: Every day | ORAL | 11 refills | Status: DC

## 2021-02-27 NOTE — Progress Notes (Signed)
Patient was assessed and managed by nursing staff during this encounter. I have reviewed the chart and agree with the documentation and plan. I have also made any necessary editorial changes.  Pt seen in person and spoke with her briefly.  The nature of [facility] with multiple MDs and other Advanced Practice Providers was explained to patient; also emphasized that residents, students are part of our team. Questions answered and patient reassured.  Advised starting PNV and decreasing/stopping THC use.  Warden Fillers, MD 02/27/2021 4:41 PM

## 2021-02-27 NOTE — Patient Instructions (Signed)
Prenatal Care Providers           [facility]  [address] [phone]  [facility]   [address]  [phone]  [facility] [place]       [address] [phone]            [facility] [place]     [address] [phone]          [facility] [place]   [address] [phone]  [facility]  [address] [phone]     [facility]  [address]   [phone]     [facility]  Phone: [phone]  [facility]  Phone: [phone]  [facility] Phone: [phone]  [facility] Phone: [phone]  [facility] Phone: [phone]  [facility] Phone: [phone]  [facility]  Phone: [phone]  

## 2021-02-27 NOTE — Progress Notes (Signed)
Here for pregnancy test which was positive. Reports LMP 01/20/21 or 01/21/21. This makes her [redacted]w[redacted]d with EDD 10/27/20. Advised to start prenatal care . Advised to start prenatal vitamin. List of provides placed in AVS. Interested in prenatal care with [place] and Mom Baby Dyad program.  Dr. Donavan Foil in to meet patient as we have not seen her in the office before. PNV RX sent in. Gwendlyon Zumbro,RN

## 2021-03-28 ENCOUNTER — Telehealth (INDEPENDENT_AMBULATORY_CARE_PROVIDER_SITE_OTHER): Payer: Medicaid Other

## 2021-03-28 DIAGNOSIS — Z3401 Encounter for supervision of normal first pregnancy, first trimester: Secondary | ICD-10-CM

## 2021-03-28 DIAGNOSIS — Z3A00 Weeks of gestation of pregnancy not specified: Secondary | ICD-10-CM

## 2021-03-28 DIAGNOSIS — Z3400 Encounter for supervision of normal first pregnancy, unspecified trimester: Secondary | ICD-10-CM

## 2021-03-28 MED ORDER — BLOOD PRESSURE KIT DEVI: 1.0000 | 0 refills | Status: DC | PRN

## 2021-03-28 NOTE — Progress Notes (Signed)
New OB Intake  I connected with  Jennifer Bird on 03/28/21 at 11:15 AM EST by MyChart Video Visit and verified that I am speaking with the correct person using two identifiers. Nurse is located at [facility] and pt is located at home.  I discussed the limitations, risks, security and privacy concerns of performing an evaluation and management service by telephone and the availability of in person appointments. I also discussed with the patient that there may be a patient responsible charge related to this service. The patient expressed understanding and agreed to proceed.  I explained I am completing New OB Intake today. We discussed her EDD of 10/27/21 that is based on LMP of 01/20/21. Pt is G2/P0. I reviewed her allergies, medications, Medical/Surgical/OB history, and appropriate screenings. I informed her of [facility] services. Based on history, this is a/an  pregnancy uncomplicated .   Patient Active Problem List   Diagnosis Date Noted   Supervision of normal pregnancy, antepartum 04/18/2016    Concerns addressed today  Delivery Plans:  Plans to deliver [facility].   MyChart/Babyscripts MyChart access verified. I explained pt will have some visits in office and some virtually. Babyscripts instructions given and order placed. Patient verifies receipt of registration text/e-mail. Account successfully created and app downloaded.  Blood Pressure Cuff  Blood pressure cuff ordered for patient to pick-up from [employer]. Explained after first prenatal appt pt will check weekly and document in Babyscripts.  Weight scale: Patient    have weight scale. Weight scale ordered for patient to pick up form [facility].   Anatomy US Explained first scheduled [place] will be around 19 weeks. Anatomy US scheduled for 06/02/21 at 10:30. Pt notified to arrive at 10:15a.  Labs Discussed Avelina Laine genetic screening with patient. Would like both Panorama and Horizon drawn at new OB visit. Routine prenatal labs  needed.  Covid Vaccine Patient has not covid vaccine.   Centering in Pregnancy Candidate?  If yes, offer as possibility- Declined   Mother/ Baby Dyad Candidate?    If yes, offer as possibility- Enrolled   Informed patient of [facility] website  and placed link in her AVS.   Social Determinants of Health Food insecurity: Reports food insecurity. Would like market [facility] Referral: Patient is not interested in referral to [facility].  Transportation: Patient expressed transportation needs. Transportation Services reviewed with patient; patient registered and phone number provided for patient to schedule rides. Childcare: Discussed no children allowed at ultrasound appointments. Offered childcare services; patient declines childcare services at this time.  Send link to Pregnancy Navigators   Placed OB Box on problem list and updated  First visit review I reviewed new OB appt with pt. I explained she will have a pelvic exam, ob bloodwork with genetic screening, and PAP smear. Explained pt will be seen by Dr. Crissie Reese at first visit; encounter routed to appropriate provider. Explained that patient will be seen by pregnancy navigator following visit with provider. [facility] information placed in AVS.   Aviva Signs, CMA 03/28/2021  11:10 AM

## 2021-03-28 NOTE — Assessment & Plan Note (Signed)
  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

## 2021-04-11 ENCOUNTER — Other Ambulatory Visit (HOSPITAL_COMMUNITY)
Admission: RE | Admit: 2021-04-11 | Discharge: 2021-04-11 | Disposition: A | Discharge status: Home / Self Care | Payer: Medicaid Other | Source: Ambulatory Visit | Attending: Family Medicine | Admitting: Family Medicine

## 2021-04-11 ENCOUNTER — Ambulatory Visit (INDEPENDENT_AMBULATORY_CARE_PROVIDER_SITE_OTHER): Payer: Medicaid Other | Admitting: Family Medicine

## 2021-04-11 ENCOUNTER — Other Ambulatory Visit: Payer: Self-pay

## 2021-04-11 VITALS — BP 108/69 | HR 96 | Wt 198.8 lb

## 2021-04-11 DIAGNOSIS — Z3401 Encounter for supervision of normal first pregnancy, first trimester: Secondary | ICD-10-CM | POA: Diagnosis present

## 2021-04-11 DIAGNOSIS — Z124 Encounter for screening for malignant neoplasm of cervix: Secondary | ICD-10-CM | POA: Insufficient documentation

## 2021-04-11 DIAGNOSIS — N760 Acute vaginitis: Secondary | ICD-10-CM

## 2021-04-11 DIAGNOSIS — B9689 Other specified bacterial agents as the cause of diseases classified elsewhere: Secondary | ICD-10-CM

## 2021-04-11 NOTE — Progress Notes (Signed)
Subjective:   Jennifer Bird is a 25 y.o. G2P0010 at 38w4dby LMP being seen today for her first obstetrical visit.  Her obstetrical history is significant for  n/a . Patient does intend to breast feed. Pregnancy history fully reviewed.  Patient reports no complaints.  HISTORY: OB History  Gravida Para Term Preterm AB Living  2 0 0 0 1 0  SAB IAB Ectopic Multiple Live Births  1 0 0 0 0    # Outcome Date GA Lbr Len/2nd Weight Sex Delivery Anes PTL Lv  2 Current           1 SAB 2016             Last pap smear: No results found for: DIAGPAP, HPV, HPVHIGH  *Needs*  Past Medical History:  Diagnosis Date   Asthma    Bronchitis    Past Surgical History:  Procedure Laterality Date   NO PAST SURGERIES     Family History  Problem Relation Age of Onset   Hypertension Mother    Healthy Father    Social History   Tobacco Use   Smoking status: Never   Smokeless tobacco: Never  Vaping Use   Vaping Use: Never used  Substance Use Topics   Alcohol use: Yes    Alcohol/week: 1.0 standard drink    Types: 1 Glasses of wine per week    Comment: occ   Drug use: No   Allergies  Allergen Reactions   Codeine Shortness Of Breath and Swelling   Other Anaphylaxis    Ketchup, mustard, mayo   Tomato Anaphylaxis   Current Outpatient Medications on File Prior to Visit  Medication Sig Dispense Refill   Blood Pressure Monitoring (BLOOD PRESSURE KIT) DEVI 1 Device by Does not apply route as needed. 1 each 0   famotidine (PEPCID) 20 MG tablet Take 1 tablet (20 mg total) by mouth daily for 5 days. 5 tablet 0   gabapentin (NEURONTIN) 300 MG capsule Take 1 capsule (300 mg total) by mouth 3 (three) times daily. Take 1 tablet on day 1, 2 tablets on day 2, 3 tablets on day 3.  Continue dosing of 3 tablets daily 90 capsule 1   Prenatal Vit-Fe Fumarate-FA (PRENATAL VITAMIN AND MINERAL) 28-0.8 MG TABS Take 1 tablet by mouth daily. 30 tablet 11   [DISCONTINUED] diphenhydrAMINE (BENADRYL) 25  mg capsule Take 1 capsule (25 mg total) by mouth every 6 (six) hours as needed. (Patient not taking: Reported on 07/23/2016) 30 capsule 0   No current facility-administered medications on file prior to visit.     Exam   There were no vitals filed for this visit.    Uterus:     Pelvic Exam: Perineum: no hemorrhoids, normal perineum   Vulva: normal external genitalia, no lesions   Vagina:  normal mucosa mild amount of white discharge but denies any symptoms   Cervix: no lesions and strangely with multiparous appreance ( reports SAB x1 in early pregnancy), pap smear done.   System: General: well-developed, well-nourished female in no acute distress   Skin: normal coloration and turgor, no rashes   Neurologic: oriented, normal, negative, normal mood   Extremities: normal strength, tone, and muscle mass, ROM of all joints is normal   HEENT PERRLA, extraocular movement intact and sclera clear, anicteric   Neck supple and no masses   Respiratory:  no respiratory distress      Assessment:   Pregnancy: G2P0010 Patient Active Problem  List   Diagnosis Date Noted   Supervision of low-risk first pregnancy 03/28/2021     Plan:  1. Encounter for supervision of low-risk first pregnancy in first trimester Initial labs drawn. Pap collected Continue prenatal vitamins. Genetic Screening discussed, NIPS: ordered. Ultrasound discussed; fetal anatomic survey: ordered. Problem list reviewed and updated. The nature of [facility] clinic was explained to patient; Voiced they may need to be seen by other [facility] providers which includes family medicine physicians, OB GYNs, and APPs. Delivery will hopefully be with one of the [facility] providers or another [facility] Medicine physician and we cannot promise this at this time.  Discussed there are [facility] staff in the hospital 24-7 and they understand and support this model and there is a likelihood one of these providers will catch their baby.  We also discussed  that the service includes learners (residents, student) and they will be involved in the care team.    Routine obstetric precautions reviewed. Return in about 4 weeks (around 05/09/2021) for [facility] patient, ob visit.

## 2021-04-12 ENCOUNTER — Encounter: Payer: Self-pay | Admitting: Family Medicine

## 2021-04-12 DIAGNOSIS — Z2839 Other underimmunization status: Secondary | ICD-10-CM | POA: Insufficient documentation

## 2021-04-12 LAB — CERVICOVAGINAL ANCILLARY ONLY
Bacterial Vaginitis (gardnerella): POSITIVE — AB
Candida Glabrata: NEGATIVE
Candida Vaginitis: NEGATIVE
Chlamydia: NEGATIVE
Comment: NEGATIVE
Comment: NEGATIVE
Comment: NEGATIVE
Comment: NEGATIVE
Comment: NEGATIVE
Comment: NORMAL
Neisseria Gonorrhea: NEGATIVE
Trichomonas: NEGATIVE

## 2021-04-12 LAB — CBC/D/PLT+RPR+RH+ABO+RUBIGG...
Antibody Screen: NEGATIVE
Basophils Absolute: 0 10*3/uL (ref 0.0–0.2)
Basos: 1 %
EOS (ABSOLUTE): 0.1 10*3/uL (ref 0.0–0.4)
Eos: 1 %
HCV Ab: <0.1 s/co ratio (ref 0.0–0.9)
HIV Screen 4th Generation wRfx: NONREACTIVE
Hematocrit: 35 % (ref 34.0–46.6)
Hemoglobin: 12.2 g/dL (ref 11.1–15.9)
Hepatitis B Surface Ag: NEGATIVE
Immature Grans (Abs): 0 10*3/uL (ref 0.0–0.1)
Immature Granulocytes: 0 %
Lymphocytes Absolute: 1.6 10*3/uL (ref 0.7–3.1)
Lymphs: 26 %
MCH: 28.3 pg (ref 26.6–33.0)
MCHC: 34.9 g/dL (ref 31.5–35.7)
MCV: 81 fL (ref 79–97)
Monocytes Absolute: 0.4 10*3/uL (ref 0.1–0.9)
Monocytes: 7 %
Neutrophils Absolute: 4 10*3/uL (ref 1.4–7.0)
Neutrophils: 65 %
Platelets: 229 10*3/uL (ref 150–450)
RBC: 4.31 x10E6/uL (ref 3.77–5.28)
RDW: 12.8 % (ref 11.7–15.4)
RPR Ser Ql: NONREACTIVE
Rh Factor: POSITIVE
Rubella Antibodies, IGG: <0.9 index — ABNORMAL LOW (ref >0.99)
WBC: 6.1 10*3/uL (ref 3.4–10.8)

## 2021-04-12 LAB — HEMOGLOBIN A1C
Est. average glucose Bld gHb Est-mCnc: 108 mg/dL
Hgb A1c MFr Bld: 5.4 % (ref 4.8–5.6)

## 2021-04-12 LAB — HCV INTERPRETATION

## 2021-04-13 LAB — CYTOLOGY - PAP
Chlamydia: NEGATIVE
Comment: NEGATIVE
Comment: NORMAL
Diagnosis: NEGATIVE
Neisseria Gonorrhea: NEGATIVE

## 2021-04-13 LAB — CULTURE, OB URINE

## 2021-04-13 LAB — URINE CULTURE, OB REFLEX

## 2021-04-13 MED ORDER — METRONIDAZOLE 0.75 % VA GEL: 1.0000 | Freq: Every day | VAGINAL | 0 refills | Status: AC

## 2021-04-13 NOTE — Addendum Note (Signed)
Addended by: Merian Capron on: 04/13/2021 11:24 AM   Modules accepted: Orders

## 2021-04-23 NOTE — L&D Delivery Note (Addendum)
Delivery Note Jennifer Bird is a 26 y.o. G2P0010 at [redacted]w[redacted]d admitted for IOL due post-dates with BPP 6/10.   GBS Status: Negative/-- (06/20 1541) Maximum Maternal Temperature: 100.3 just after delivery, afebrile during labor course   Labor course: Initial SVE: 1.5/thick/-3. Augmentation with: AROM, Pitocin, Cytotec, and IP Foley. She then progressed to complete.   ROM: 13h 53m with meconium-stained fluid  Birth: At 2305 a viable female was delivered via spontaneous vaginal delivery (Presentation: LOA). Nuchal cord present: No.  Shoulders and body delivered in usual fashion. Infant placed directly on mom's abdomen for bonding/skin-to-skin, baby dried and stimulated. Cord clamped x 2 after 1 minute and cut by FOB.  Cord blood collected.  The placenta separated spontaneously and delivered via gentle cord traction.  Pitocin infused rapidly IV per protocol.  Fundus firm with massage.   Placenta inspected and appeared to be intact with a 3 VC.  Placenta/Cord with the following complications: None.   Perineum, vagina, and cervix inspected. Patient was found to have a 2nd degree perineal laceration that was repaired with 3-0 Vicryl and found to be hemostatic.   Sponge and instrument count were correct x 2.  Intrapartum complications:  None Anesthesia:  Epidural Episiotomy: No Lacerations:  2nd degree perineal Suture Repair: 3.0 Vicryl EBL (mL): 50  Infant: APGAR (1 MIN): 9   APGAR (5 MINS): 9   APGAR (10 MINS):    Infant weight: Pending  Mom to postpartum.  Baby to Couplet care / Skin to Skin. Placenta to L&D    Raylene Everts, MD 11/03/2021 11:34 PM  GME ATTESTATION:  I saw and evaluated the patient. I was present and gloved for the entire delivery and management of the patient. I agree with the findings and the plan of care as documented in the resident's note. I have made changes to documentation as necessary.  Jennifer Field, MD OB Fellow, Faculty [facility]  for [facility] 11/03/2021 11:51 PM

## 2021-05-01 ENCOUNTER — Encounter: Payer: Self-pay | Admitting: Family Medicine

## 2021-05-01 ENCOUNTER — Ambulatory Visit (INDEPENDENT_AMBULATORY_CARE_PROVIDER_SITE_OTHER): Payer: Medicaid Other | Admitting: Family Medicine

## 2021-05-01 ENCOUNTER — Other Ambulatory Visit: Payer: Self-pay

## 2021-05-01 VITALS — BP 118/78 | HR 91 | Wt 202.7 lb

## 2021-05-01 DIAGNOSIS — Z3401 Encounter for supervision of normal first pregnancy, first trimester: Secondary | ICD-10-CM

## 2021-05-01 DIAGNOSIS — O219 Vomiting of pregnancy, unspecified: Secondary | ICD-10-CM

## 2021-05-01 MED ORDER — PROMETHAZINE HCL 25 MG PO TABS: 25.0000 mg | ORAL_TABLET | Freq: Four times a day (QID) | ORAL | 0 refills | Status: DC | PRN

## 2021-05-01 NOTE — Progress Notes (Signed)
° °  PRENATAL VISIT NOTE  Subjective:  Jennifer Bird is a 26 y.o. G2P0010 at [redacted]w[redacted]d being seen today for ongoing prenatal care.  She is currently monitored for the following issues for this low-risk pregnancy and has Supervision of low-risk first pregnancy and Rubella non-immune status, antepartum on their problem list.  Patient reports no complaints.  Contractions: Not present. Vag. Bleeding: None.  Movement: Absent. Denies leaking of fluid.   The following portions of the patient's history were reviewed and updated as appropriate: allergies, current medications, past family history, past medical history, past social history, past surgical history and problem list.   Objective:   Vitals:   05/01/21 0941  BP: 118/78  Pulse: 91  Weight: 202 lb 11.2 oz (91.9 kg)    Fetal Status: Fetal Heart Rate (bpm): 147 Fundal Height: 14 cm Movement: Absent     General:  Alert, oriented and cooperative. Patient is in no acute distress.  Skin: Skin is warm and dry. No rash noted.   Cardiovascular: Normal heart rate noted  Respiratory: Normal respiratory effort, no problems with respiration noted  Abdomen: Soft, gravid, appropriate for gestational age.  Pain/Pressure: Present     Pelvic: Cervical exam deferred        Extremities: Normal range of motion.  Edema: None  Mental Status: Normal mood and affect. Normal behavior. Normal judgment and thought content.   Assessment and Plan:  Pregnancy: G2P0010 at [redacted]w[redacted]d 1. Encounter for supervision of low-risk first pregnancy in first trimester Up to date Discussed increasing protein in diet  TWG=11.2 oz (0.318 kg)   2. Nausea and vomiting in pregnancy - Recommended 6 smaller meals - Recommended TUMS with final meal to help with NV over night as there maybe reflux - promethazine (PHENERGAN) 25 MG tablet; Take 1 tablet (25 mg total) by mouth every 6 (six) hours as needed for nausea or vomiting.  Dispense: 30 tablet; Refill: 0  Preterm labor symptoms and  general obstetric precautions including but not limited to vaginal bleeding, contractions, leaking of fluid and fetal movement were reviewed in detail with the patient. Please refer to After Visit Summary for other counseling recommendations.   Return in about 4 weeks (around 05/29/2021) for Routine prenatal care, Mom+Baby Combined Care, scheduled visit.  Future Appointments  Date Time Provider Department Center  05/30/2021  9:35 AM [facility]  06/02/2021 10:45 AM [facility]    Federico Flake, MD

## 2021-05-07 ENCOUNTER — Inpatient Hospital Stay (HOSPITAL_COMMUNITY)
Admission: AD | Admit: 2021-05-07 | Discharge: 2021-05-08 | Disposition: A | Discharge status: Home / Self Care | Payer: Medicaid Other | Attending: Obstetrics & Gynecology | Admitting: Obstetrics & Gynecology

## 2021-05-07 ENCOUNTER — Other Ambulatory Visit: Payer: Self-pay

## 2021-05-07 ENCOUNTER — Encounter (HOSPITAL_COMMUNITY): Payer: Self-pay | Admitting: Obstetrics & Gynecology

## 2021-05-07 DIAGNOSIS — Z3A15 15 weeks gestation of pregnancy: Secondary | ICD-10-CM | POA: Diagnosis not present

## 2021-05-07 DIAGNOSIS — Z3401 Encounter for supervision of normal first pregnancy, first trimester: Secondary | ICD-10-CM

## 2021-05-07 DIAGNOSIS — O26892 Other specified pregnancy related conditions, second trimester: Secondary | ICD-10-CM | POA: Diagnosis present

## 2021-05-07 DIAGNOSIS — O98512 Other viral diseases complicating pregnancy, second trimester: Secondary | ICD-10-CM | POA: Diagnosis not present

## 2021-05-07 DIAGNOSIS — Z28310 Unvaccinated for covid-19: Secondary | ICD-10-CM | POA: Diagnosis not present

## 2021-05-07 DIAGNOSIS — R519 Headache, unspecified: Secondary | ICD-10-CM | POA: Diagnosis not present

## 2021-05-07 DIAGNOSIS — U071 COVID-19: Secondary | ICD-10-CM

## 2021-05-07 HISTORY — DX: COVID-19: U07.1

## 2021-05-07 LAB — CBC WITH DIFFERENTIAL/PLATELET
Abs Immature Granulocytes: 0.02 10*3/uL (ref 0.00–0.07)
Basophils Absolute: 0 10*3/uL (ref 0.0–0.1)
Basophils Relative: 0 %
Eosinophils Absolute: 0 10*3/uL (ref 0.0–0.5)
Eosinophils Relative: 0 %
HCT: 30.9 % — ABNORMAL LOW (ref 36.0–46.0)
Hemoglobin: 10.9 g/dL — ABNORMAL LOW (ref 12.0–15.0)
Immature Granulocytes: 0 %
Lymphocytes Relative: 9 %
Lymphs Abs: 0.4 10*3/uL — ABNORMAL LOW (ref 0.7–4.0)
MCH: 29.5 pg (ref 26.0–34.0)
MCHC: 35.3 g/dL (ref 30.0–36.0)
MCV: 83.5 fL (ref 80.0–100.0)
Monocytes Absolute: 0.6 10*3/uL (ref 0.1–1.0)
Monocytes Relative: 11 %
Neutro Abs: 3.9 10*3/uL (ref 1.7–7.7)
Neutrophils Relative %: 80 %
Platelets: 179 10*3/uL (ref 150–400)
RBC: 3.7 MIL/uL — ABNORMAL LOW (ref 3.87–5.11)
RDW: 13.1 % (ref 11.5–15.5)
WBC: 4.9 10*3/uL (ref 4.0–10.5)
nRBC: 0 % (ref 0.0–0.2)

## 2021-05-07 LAB — URINALYSIS, ROUTINE W REFLEX MICROSCOPIC
Bilirubin Urine: NEGATIVE
Glucose, UA: NEGATIVE mg/dL
Hgb urine dipstick: NEGATIVE
Ketones, ur: >80 mg/dL — AB
Leukocytes,Ua: NEGATIVE
Nitrite: NEGATIVE
Protein, ur: NEGATIVE mg/dL
Specific Gravity, Urine: >1.03 — ABNORMAL HIGH (ref 1.005–1.030)
pH: 6 (ref 5.0–8.0)

## 2021-05-07 LAB — COMPREHENSIVE METABOLIC PANEL
ALT: 15 U/L (ref 0–44)
AST: 16 U/L (ref 15–41)
Albumin: 3.1 g/dL — ABNORMAL LOW (ref 3.5–5.0)
Alkaline Phosphatase: 38 U/L (ref 38–126)
Anion gap: 9 (ref 5–15)
BUN: 6 mg/dL (ref 6–20)
CO2: 22 mmol/L (ref 22–32)
Calcium: 9.1 mg/dL (ref 8.9–10.3)
Chloride: 102 mmol/L (ref 98–111)
Creatinine, Ser: 0.63 mg/dL (ref 0.44–1.00)
GFR, Estimated: >60 mL/min (ref >60)
Glucose, Bld: 85 mg/dL (ref 70–99)
Potassium: 3.2 mmol/L — ABNORMAL LOW (ref 3.5–5.1)
Sodium: 133 mmol/L — ABNORMAL LOW (ref 135–145)
Total Bilirubin: 0.3 mg/dL (ref 0.3–1.2)
Total Protein: 6.1 g/dL — ABNORMAL LOW (ref 6.5–8.1)

## 2021-05-07 LAB — RESP PANEL BY RT-PCR (FLU A&B, COVID) ARPGX2
Influenza A by PCR: NEGATIVE
Influenza B by PCR: NEGATIVE
SARS Coronavirus 2 by RT PCR: POSITIVE — AB

## 2021-05-07 MED ORDER — ACETAMINOPHEN 500 MG PO TABS
1000.0000 mg | ORAL_TABLET | Freq: Once | ORAL | Status: AC
  Administered 2021-05-07: 1000 mg via ORAL
  Filled 2021-05-07: qty 2

## 2021-05-07 MED ORDER — LACTATED RINGERS IV BOLUS
1000.0000 mL | Freq: Once | INTRAVENOUS | Status: AC
  Administered 2021-05-07: 1000 mL via INTRAVENOUS

## 2021-05-07 MED ORDER — SODIUM CHLORIDE 0.9 % IV SOLN
25.0000 mg | Freq: Once | INTRAVENOUS | Status: AC
  Administered 2021-05-07: 25 mg via INTRAVENOUS
  Filled 2021-05-07: qty 1

## 2021-05-07 NOTE — MAU Note (Signed)
Jennifer Bird is a 26 y.o. at [redacted]w[redacted]d here in MAU reporting: Pt c/o of "migraine" since last night. Has never had a migraine before and took no meds. Has felt nauseous and dizzy. Feel pain all over her body. Says her stomach hurts when she coughs. No exposure to Covid or flu.  LMP: 01/20/21 Onset of complaint: 05/06/21 Pain score: 10/10 Vitals:   05/07/21 2005  BP: 120/69  Pulse: (!) 107  Resp: 16  Temp: 99.9 F (37.7 C)  SpO2: 98%     FHT:156  Lab orders placed from triage:  Urinalysis

## 2021-05-07 NOTE — Discharge Instructions (Signed)
-  you can take Robitussin, Tylenol, Ibuprofen, Delsym and cough drops. It is normal to have HA, body aches, chills, fever, and generally feel like you have the flu.   -return to MAU if you feel short of breath or you develop chest pain or you find that you are breathing fast or can't get enough oxygen

## 2021-05-07 NOTE — MAU Provider Note (Deleted)
History     CSN: [id number]  Arrival date and time: 05/07/21 1941   Event Date/Time   First Provider Initiated Contact with Patient 05/07/21 2014      Chief Complaint  Patient presents with   Headache   HPI Jennifer Bird is a 26 y.o. G2P0010 at [redacted]w[redacted]d who presents to MAU via EMS with chief complaint of headache. This is a new problem, onset last night. Pain score is 10/10 on arrival. Pain is bilateral, behind her brown bone, radiating around her eyes to her cheekbones. Patient has not taken medication or tried other treatments for this complaint.   Patient also c/o body aches acordd her entire body and loss of appetuite These are new problems, onset today. She did not receive a COVID vaccine. She denies sick contacts. She denies fever, dysuria, constipation, chest pain, SOB.  OB History     Gravida  2   Para  0   Term  0   Preterm  0   AB  1   Living  0      SAB  1   IAB  0   Ectopic  0   Multiple  0   Live Births  0           Past Medical History:  Diagnosis Date   Asthma    Bronchitis    COVID-19 05/07/2021    Past Surgical History:  Procedure Laterality Date   NO PAST SURGERIES      Family History  Problem Relation Age of Onset   Hypertension Mother    Healthy Father     Social History   Tobacco Use   Smoking status: Never   Smokeless tobacco: Never  Vaping Use   Vaping Use: Never used  Substance Use Topics   Alcohol use: Yes    Alcohol/week: 1.0 standard drink    Types: 1 Glasses of wine per week    Comment: occ   Drug use: No    Allergies:  Allergies  Allergen Reactions   Codeine Shortness Of Breath and Swelling   Other Anaphylaxis    Ketchup, mustard, mayo   Tomato Anaphylaxis    Medications Prior to Admission  Medication Sig Dispense Refill Last Dose   Blood Pressure Monitoring (BLOOD PRESSURE KIT) DEVI 1 Device by Does not apply route as needed. 1 each 0 05/07/2021   famotidine (PEPCID) 20 MG tablet Take 1 tablet  (20 mg total) by mouth daily for 5 days. 5 tablet 0 Past Week   Prenatal Vit-Fe Fumarate-FA (PRENATAL VITAMIN AND MINERAL) 28-0.8 MG TABS Take 1 tablet by mouth daily. 30 tablet 11 05/07/2021   promethazine (PHENERGAN) 25 MG tablet Take 1 tablet (25 mg total) by mouth every 6 (six) hours as needed for nausea or vomiting. 30 tablet 0 Past Week   gabapentin (NEURONTIN) 300 MG capsule Take 1 capsule (300 mg total) by mouth 3 (three) times daily. Take 1 tablet on day 1, 2 tablets on day 2, 3 tablets on day 3.  Continue dosing of 3 tablets daily 90 capsule 1     Review of Systems  Constitutional:  Positive for fatigue.  Musculoskeletal:  Positive for arthralgias.  Neurological:  Positive for headaches.  All other systems reviewed and are negative. Physical Exam   Blood pressure 120/69, pulse (!) 107, temperature 99.9 F (37.7 C), resp. rate 16, height $RemoveBe'5\' 9"'KYUUbAEZE$  (1.753 m), weight 93.2 kg, last menstrual period 01/20/2021, SpO2 98 %, unknown if currently breastfeeding.  Physical  Exam Vitals and nursing note reviewed.  Constitutional:      Appearance: She is well-developed. She is not ill-appearing.  Cardiovascular:     Rate and Rhythm: Normal rate and regular rhythm.     Heart sounds: Normal heart sounds.  Pulmonary:     Effort: Pulmonary effort is normal.     Breath sounds: Normal breath sounds.  Abdominal:     Palpations: Abdomen is soft.  Skin:    General: Skin is warm.  Neurological:     Mental Status: She is alert and oriented to person, place, and time.  Psychiatric:        Mood and Affect: Mood normal.        Speech: Speech normal.        Behavior: Behavior normal.    MAU Course  Procedures  Orders Placed This Encounter  Procedures   Resp Panel by RT-PCR (Flu A&B, Covid) Nasopharyngeal Swab   Urinalysis, Routine w reflex microscopic Urine, Clean Catch   CBC with Differential/Platelet   Comprehensive metabolic panel   Airborne and Contact precautions   Insert peripheral IV    Discharge patient Discharge disposition: 01-Home or Self Care; Discharge patient date: 05/07/2021   Reports given to K. Ardean Larsen, CNM who assumes care of patient at this time  Mallie Snooks, [place], MSN, CNM Certified Nurse Midwife, [facility] for [employer], [place] 05/07/21 11:13 PM    Assessment and Plan   1. COVID-19   2. Encounter for supervision of low-risk first pregnancy in first trimester    -patient stable for discharge with strict return precautions including SOB, chest pain, feeling like she is struggling to breathe or ob complaints.  -work note given for patient to be out until Day #6 (Day # 0 is first day of symptoms, which was Saturday, January 14) -patient declines Paxlovid RX -patient given suggestions for how to manage symptoms at home, including safe medications to take for cough, HA

## 2021-05-08 ENCOUNTER — Telehealth: Payer: Self-pay | Admitting: Family Medicine

## 2021-05-08 DIAGNOSIS — Z3A15 15 weeks gestation of pregnancy: Secondary | ICD-10-CM

## 2021-05-08 DIAGNOSIS — U071 COVID-19: Secondary | ICD-10-CM

## 2021-05-08 MED ORDER — NIRMATRELVIR/RITONAVIR (PAXLOVID)TABLET: 3.0000 | ORAL_TABLET | Freq: Two times a day (BID) | ORAL | 0 refills | Status: AC

## 2021-05-08 NOTE — MAU Provider Note (Signed)
History     CSN: [id number]  Arrival date and time: 05/07/21 1941   Event Date/Time   First Provider Initiated Contact with Patient 05/07/21 2014      Chief Complaint  Patient presents with   Headache   Headache   Jennifer Bird is a 26 y.o. G2P0010 at [redacted]w[redacted]d who presents to MAU via EMS with chief complaint of headache. This is a new problem, onset last night. Pain score is 10/10 on arrival. Pain is bilateral, behind her brown bone, radiating around her eyes to her cheekbones. Patient has not taken medication or tried other treatments for this complaint.   Patient also c/o body aches acordd her entire body and loss of appetuite These are new problems, onset today. She did not receive a COVID vaccine. She denies sick contacts. She denies fever, dysuria, constipation, chest pain, SOB.  OB History     Gravida  2   Para  0   Term  0   Preterm  0   AB  1   Living  0      SAB  1   IAB  0   Ectopic  0   Multiple  0   Live Births  0           Past Medical History:  Diagnosis Date   Asthma    Bronchitis    COVID-19 05/07/2021    Past Surgical History:  Procedure Laterality Date   NO PAST SURGERIES      Family History  Problem Relation Age of Onset   Hypertension Mother    Healthy Father     Social History   Tobacco Use   Smoking status: Never   Smokeless tobacco: Never  Vaping Use   Vaping Use: Never used  Substance Use Topics   Alcohol use: Yes    Alcohol/week: 1.0 standard drink    Types: 1 Glasses of wine per week    Comment: occ   Drug use: No    Allergies:  Allergies  Allergen Reactions   Codeine Shortness Of Breath and Swelling   Other Anaphylaxis    Ketchup, mustard, mayo   Tomato Anaphylaxis    No medications prior to admission.    Review of Systems  Constitutional:  Positive for fatigue.  Musculoskeletal:  Positive for arthralgias.  Neurological:  Positive for headaches.  All other systems reviewed and are  negative. Physical Exam   Blood pressure (!) 102/51, pulse 91, temperature 98.4 F (36.9 C), resp. rate 18, height 5\' 9"  (1.753 m), weight 93.2 kg, last menstrual period 01/20/2021, SpO2 100 %, unknown if currently breastfeeding.  Physical Exam Vitals and nursing note reviewed.  Constitutional:      Appearance: She is well-developed. She is not ill-appearing.  Cardiovascular:     Rate and Rhythm: Normal rate and regular rhythm.     Heart sounds: Normal heart sounds.  Pulmonary:     Effort: Pulmonary effort is normal.     Breath sounds: Normal breath sounds.  Abdominal:     Palpations: Abdomen is soft.  Skin:    General: Skin is warm.  Neurological:     Mental Status: She is alert and oriented to person, place, and time.  Psychiatric:        Mood and Affect: Mood normal.        Speech: Speech normal.        Behavior: Behavior normal.    MAU Course  Procedures  Orders Placed This Encounter  Procedures   Resp Panel by RT-PCR (Flu A&B, Covid) Nasopharyngeal Swab   Urinalysis, Routine w reflex microscopic Urine, Clean Catch   CBC with Differential/Platelet   Comprehensive metabolic panel   Airborne and Contact precautions   Insert peripheral IV   Discharge patient Discharge disposition: 01-Home or Self Care; Discharge patient date: 05/07/2021   Discharge patient Discharge disposition: 01-Home or Self Care; Discharge patient date: 05/07/2021   Reports given to K. Crisoforo Oxford, CNM who assumes care of patient at this time  Clayton Bibles, MSA, MSN, CNM Certified Nurse Midwife, [facility] for [employer], [facility] 05/08/21 2:18 AM    Assessment and Plan   1. COVID-19   2. Encounter for supervision of low-risk first pregnancy in first trimester   3. [redacted] weeks gestation of pregnancy   -patient had bag of Phenergan in MAU and HA now a 5/10 down from a 10/10 -patient stable for discharge with strict return precautions including SOB, chest  pain, feeling like she is struggling to breathe or ob complaints.  -work note given for patient to be out until Day #6 (Day # 0 is first day of symptoms, which was Saturday, January 14) -patient declines Paxlovid RX -patient given suggestions for how to manage symptoms at home, including safe medications to take for cough, HA

## 2021-05-08 NOTE — Telephone Encounter (Signed)
Patient called - dx'd with COVID. Did want paxlovid.  Paxlovid prescribed. Recommended tylenol and mucinex dm to help with other symptoms.  Levie Heritage, DO

## 2021-05-10 ENCOUNTER — Encounter (HOSPITAL_COMMUNITY): Payer: Self-pay | Admitting: Obstetrics and Gynecology

## 2021-05-10 ENCOUNTER — Other Ambulatory Visit: Payer: Self-pay

## 2021-05-10 ENCOUNTER — Inpatient Hospital Stay (HOSPITAL_COMMUNITY)
Admission: AD | Admit: 2021-05-10 | Discharge: 2021-05-11 | Disposition: A | Discharge status: Home / Self Care | Payer: Medicaid Other | Attending: Obstetrics and Gynecology | Admitting: Obstetrics and Gynecology

## 2021-05-10 ENCOUNTER — Inpatient Hospital Stay (HOSPITAL_COMMUNITY): Payer: Medicaid Other

## 2021-05-10 DIAGNOSIS — O26892 Other specified pregnancy related conditions, second trimester: Secondary | ICD-10-CM | POA: Diagnosis not present

## 2021-05-10 DIAGNOSIS — R0602 Shortness of breath: Secondary | ICD-10-CM | POA: Diagnosis not present

## 2021-05-10 DIAGNOSIS — Z3A15 15 weeks gestation of pregnancy: Secondary | ICD-10-CM | POA: Diagnosis not present

## 2021-05-10 DIAGNOSIS — O99891 Other specified diseases and conditions complicating pregnancy: Secondary | ICD-10-CM | POA: Diagnosis not present

## 2021-05-10 DIAGNOSIS — O98512 Other viral diseases complicating pregnancy, second trimester: Secondary | ICD-10-CM | POA: Diagnosis present

## 2021-05-10 DIAGNOSIS — R079 Chest pain, unspecified: Secondary | ICD-10-CM | POA: Diagnosis not present

## 2021-05-10 DIAGNOSIS — U071 COVID-19: Secondary | ICD-10-CM | POA: Diagnosis not present

## 2021-05-10 DIAGNOSIS — M549 Dorsalgia, unspecified: Secondary | ICD-10-CM | POA: Insufficient documentation

## 2021-05-10 LAB — CBC
HCT: 31.4 % — ABNORMAL LOW (ref 36.0–46.0)
Hemoglobin: 11.1 g/dL — ABNORMAL LOW (ref 12.0–15.0)
MCH: 29.4 pg (ref 26.0–34.0)
MCHC: 35.4 g/dL (ref 30.0–36.0)
MCV: 83.1 fL (ref 80.0–100.0)
Platelets: 176 10*3/uL (ref 150–400)
RBC: 3.78 MIL/uL — ABNORMAL LOW (ref 3.87–5.11)
RDW: 13.1 % (ref 11.5–15.5)
WBC: 4.4 10*3/uL (ref 4.0–10.5)
nRBC: 0 % (ref 0.0–0.2)

## 2021-05-10 LAB — COMPREHENSIVE METABOLIC PANEL
ALT: 25 U/L (ref 0–44)
AST: 22 U/L (ref 15–41)
Albumin: 3.2 g/dL — ABNORMAL LOW (ref 3.5–5.0)
Alkaline Phosphatase: 36 U/L — ABNORMAL LOW (ref 38–126)
Anion gap: 5 (ref 5–15)
BUN: <5 mg/dL — ABNORMAL LOW (ref 6–20)
CO2: 22 mmol/L (ref 22–32)
Calcium: 8.5 mg/dL — ABNORMAL LOW (ref 8.9–10.3)
Chloride: 107 mmol/L (ref 98–111)
Creatinine, Ser: 0.52 mg/dL (ref 0.44–1.00)
GFR, Estimated: >60 mL/min (ref >60)
Glucose, Bld: 107 mg/dL — ABNORMAL HIGH (ref 70–99)
Potassium: 3.5 mmol/L (ref 3.5–5.1)
Sodium: 134 mmol/L — ABNORMAL LOW (ref 135–145)
Total Bilirubin: 0.2 mg/dL — ABNORMAL LOW (ref 0.3–1.2)
Total Protein: 6 g/dL — ABNORMAL LOW (ref 6.5–8.1)

## 2021-05-10 LAB — BRAIN NATRIURETIC PEPTIDE: B Natriuretic Peptide: 39.7 pg/mL (ref 0.0–100.0)

## 2021-05-10 MED ORDER — ACETAMINOPHEN 500 MG PO TABS
1000.0000 mg | ORAL_TABLET | Freq: Once | ORAL | Status: AC
  Administered 2021-05-10: 1000 mg via ORAL
  Filled 2021-05-10: qty 2

## 2021-05-10 MED ORDER — ALBUTEROL SULFATE (2.5 MG/3ML) 0.083% IN NEBU
2.5000 mg | INHALATION_SOLUTION | Freq: Once | RESPIRATORY_TRACT | Status: AC
  Administered 2021-05-11: 2.5 mg via RESPIRATORY_TRACT
  Filled 2021-05-10: qty 3

## 2021-05-10 MED ORDER — ONDANSETRON 4 MG PO TBDP: 4.0000 mg | ORAL_TABLET | Freq: Three times a day (TID) | ORAL | 0 refills | Status: DC | PRN

## 2021-05-10 MED ORDER — TRAMADOL HCL 50 MG PO TABS: 50.0000 mg | ORAL_TABLET | Freq: Four times a day (QID) | ORAL | 0 refills | Status: DC | PRN

## 2021-05-10 MED ORDER — TRAMADOL HCL 50 MG PO TABS
100.0000 mg | ORAL_TABLET | Freq: Once | ORAL | Status: DC
  Filled 2021-05-10: qty 2

## 2021-05-10 MED ORDER — ONDANSETRON 4 MG PO TBDP
4.0000 mg | ORAL_TABLET | Freq: Once | ORAL | Status: DC
  Filled 2021-05-10 (×2): qty 1

## 2021-05-10 NOTE — Discharge Instructions (Signed)

## 2021-05-10 NOTE — MAU Provider Note (Signed)
Chief Complaint: Back Pain, Chest Pain, and Nausea   Event Date/Time   First Provider Initiated Contact with Patient 05/10/21 2202      SUBJECTIVE HPI: Jennifer Bird is a 26 y.o. G2P0010 at 4w5dby LMP who presents to maternity admissions reporting chest pain, shortness of breath, nausea and constant back pain with positive COVID test on 05/07/21.  She reports headache as  her first COVID symptom, which has resolved but onset of runny nose, cough, then chest pain, SOB, cough, nausea and back pain 2 days ago which have worsened. She feels like something is sitting on her chest and she is having to work to take a breath.  She has taken Tylenol, Phenergan, and used her albuterol inhaler at home. The nausea is improved but Tylenol and her inhaler have not helped.  She denies abdominal pain, vaginal bleeding, vaginal itching/burning, urinary symptoms, h/a, dizziness, or fever/chills.    HPI  Past Medical History:  Diagnosis Date   Asthma    Bronchitis    COVID-19 05/07/2021   Past Surgical History:  Procedure Laterality Date   NO PAST SURGERIES     Social History   Socioeconomic History   Marital status: Single    Spouse name: Not on file   Number of children: Not on file   Years of education: Not on file   Highest education level: Not on file  Occupational History   Not on file  Tobacco Use   Smoking status: Never   Smokeless tobacco: Never  Vaping Use   Vaping Use: Never used  Substance and Sexual Activity   Alcohol use: Yes    Alcohol/week: 1.0 standard drink    Types: 1 Glasses of wine per week    Comment: occ   Drug use: No   Sexual activity: Not Currently    Birth control/protection: None  Other Topics Concern   Not on file  Social History Narrative   Not on file   Social Determinants of Health   Financial Resource Strain: Not on file  Food Insecurity: Not on file  Transportation Needs: Not on file  Physical Activity: Not on file  Stress: Not on file  Social  Connections: Not on file  Intimate Partner Violence: Not on file   No current facility-administered medications on file prior to encounter.   Current Outpatient Medications on File Prior to Encounter  Medication Sig Dispense Refill   nirmatrelvir/ritonavir EUA (PAXLOVID) 20 x 150 MG & 10 x 100MG TABS Take 3 tablets by mouth 2 (two) times daily for 5 days. Patient GFR is normal. Take nirmatrelvir (150 mg) two tablets twice daily for 5 days and ritonavir (100 mg) one tablet twice daily for 5 days. 30 tablet 0   Prenatal Vit-Fe Fumarate-FA (PRENATAL VITAMIN AND MINERAL) 28-0.8 MG TABS Take 1 tablet by mouth daily. 30 tablet 11   promethazine (PHENERGAN) 25 MG tablet Take 1 tablet (25 mg total) by mouth every 6 (six) hours as needed for nausea or vomiting. 30 tablet 0   Blood Pressure Monitoring (BLOOD PRESSURE KIT) DEVI 1 Device by Does not apply route as needed. 1 each 0   famotidine (PEPCID) 20 MG tablet Take 1 tablet (20 mg total) by mouth daily for 5 days. 5 tablet 0   [DISCONTINUED] diphenhydrAMINE (BENADRYL) 25 mg capsule Take 1 capsule (25 mg total) by mouth every 6 (six) hours as needed. (Patient not taking: Reported on 07/23/2016) 30 capsule 0   Allergies  Allergen Reactions   Codeine  Shortness Of Breath and Swelling    Pt has taken other pain medications, including oxycodone without reaction.   Other Anaphylaxis    Ketchup, mustard, mayo   Tomato Anaphylaxis    ROS:  Review of Systems  Constitutional:  Negative for chills, fatigue and fever.  HENT:  Positive for congestion and rhinorrhea.   Respiratory:  Positive for cough and shortness of breath.   Cardiovascular:  Positive for chest pain.  Gastrointestinal:  Positive for nausea and vomiting. Negative for abdominal pain.  Genitourinary:  Negative for difficulty urinating, dysuria, flank pain, pelvic pain, vaginal bleeding, vaginal discharge and vaginal pain.  Musculoskeletal:  Positive for back pain.  Neurological:  Negative for  dizziness and headaches.  Psychiatric/Behavioral: Negative.      I have reviewed patient's Past Medical Hx, Surgical Hx, Family Hx, Social Hx, medications and allergies.   Physical Exam  Patient Vitals for the past 24 hrs:  BP Temp Temp src Pulse Resp SpO2 Height Weight  05/11/21 0050 108/64 -- -- 75 -- 100 % -- --  05/11/21 0032 -- -- -- -- -- 100 % -- --  05/10/21 2155 114/69 -- -- 75 20 -- -- --  05/10/21 2135 121/71 98.4 F (36.9 C) Oral 82 18 -- _0  (1.753 m) 92.2 kg  05/10/21 2134 -- -- -- -- -- 100 % -- --   Constitutional: Well-developed, well-nourished female in no acute distress.  HEART: normal rate, heart sounds, regular rhythm RESP: normal effort, lung sounds clear and equal bilaterally  GI: Abd soft, non-tender. Pos BS x 4 MS: Extremities nontender, no edema, normal ROM Neurologic: Alert and oriented x 4.  GU: Neg CVAT.  PELVIC EXAM: Deferred  FHT 145 by doppler  LAB RESULTS Results for orders placed or performed during the hospital encounter of 05/10/21 (from the past 24 hour(s))  CBC     Status: Abnormal   Collection Time: 05/10/21 10:54 PM  Result Value Ref Range   WBC 4.4 4.0 - 10.5 K/uL   RBC 3.78 (L) 3.87 - 5.11 MIL/uL   Hemoglobin 11.1 (L) 12.0 - 15.0 g/dL   HCT 31.4 (L) 36.0 - 46.0 %   MCV 83.1 80.0 - 100.0 fL   MCH 29.4 26.0 - 34.0 pg   MCHC 35.4 30.0 - 36.0 g/dL   RDW 13.1 11.5 - 15.5 %   Platelets 176 150 - 400 K/uL   nRBC 0.0 0.0 - 0.2 %  Comprehensive metabolic panel     Status: Abnormal   Collection Time: 05/10/21 10:54 PM  Result Value Ref Range   Sodium 134 (L) 135 - 145 mmol/L   Potassium 3.5 3.5 - 5.1 mmol/L   Chloride 107 98 - 111 mmol/L   CO2 22 22 - 32 mmol/L   Glucose, Bld 107 (H) 70 - 99 mg/dL   BUN <5 (L) 6 - 20 mg/dL   Creatinine, Ser 0.52 0.44 - 1.00 mg/dL   Calcium 8.5 (L) 8.9 - 10.3 mg/dL   Total Protein 6.0 (L) 6.5 - 8.1 g/dL   Albumin 3.2 (L) 3.5 - 5.0 g/dL   AST 22 15 - 41 U/L   ALT 25 0 - 44 U/L   Alkaline  Phosphatase 36 (L) 38 - 126 U/L   Total Bilirubin 0.2 (L) 0.3 - 1.2 mg/dL   GFR, Estimated >60 >60 mL/min   Anion gap 5 5 - 15  Brain natriuretic peptide     Status: None   Collection Time: 05/10/21 10:54 PM  Result Value Ref Range   B Natriuretic Peptide 39.7 0.0 - 100.0 pg/mL    B/Positive/-- (12/20 0954)  IMAGING DG Chest Portable 1 View  Result Date: 05/10/2021 CLINICAL DATA:  chest pain, covid positive EXAM: PORTABLE CHEST 1 VIEW.  Slight patient rotation. COMPARISON:  None. FINDINGS: Slightly more prominent cardiac silhouette likely due to AP portable technique. Otherwise the heart and mediastinal contours are unchanged. No focal consolidation. No pulmonary edema. No pleural effusion. No pneumothorax. No acute osseous abnormality. IMPRESSION: No active disease. Electronically Signed   By: Iven Finn M.D.   On: 05/10/2021 22:37    MAU Management/MDM: Orders Placed This Encounter  Procedures   DG Chest Portable 1 View   CBC   Comprehensive metabolic panel   Brain natriuretic peptide   ED EKG   Discharge patient    Meds ordered this encounter  Medications   DISCONTD: traMADol (ULTRAM) tablet 100 mg   DISCONTD: ondansetron (ZOFRAN-ODT) disintegrating tablet 4 mg   albuterol (PROVENTIL) (2.5 MG/3ML) 0.083% nebulizer solution 2.5 mg   acetaminophen (TYLENOL) tablet 1,000 mg   ondansetron (ZOFRAN-ODT) 4 MG disintegrating tablet    Sig: Take 1 tablet (4 mg total) by mouth every 8 (eight) hours as needed for nausea or vomiting.    Dispense:  20 tablet    Refill:  0    Order Specific Question:   Supervising Provider    Answer:   Lynnda Shields A [[id number]]   traMADol (ULTRAM) 50 MG tablet    Sig: Take 1-2 tablets (50-100 mg total) by mouth every 6 (six) hours as needed.    Dispense:  10 tablet    Refill:  0    Order Specific Question:   Supervising Provider    Answer:   Lynnda Shields A [[id number]]    Pt without acute findings, with normal chest X-ray.  No tachycardia and  O2 saturation 100% on RA, low suspicion for PE.  Tramadol and Zofran ordered in MAU for symptoms.  Pt with ? History of asthma/chronic bronchitis so nebulizer tx with albuterol in MAU. Pt with improved chest pain/shortness of breath after nebulizer treatment.  Unable to give Tramadol in MAU since pt driving, but Rx for Tramadol x 10 tabs sent to pharmacy for COVID aches and pains since Tylenol has not been effective.  COVID, preterm labor, return precautions reviewed.     ASSESSMENT 1. COVID-19 affecting pregnancy in second trimester   2. Shortness of breath   3. Chest pain during pregnancy   4. [redacted] weeks gestation of pregnancy   5. Back pain affecting pregnancy in second trimester     PLAN Discharge home Allergies as of 05/11/2021       Reactions   Codeine Shortness Of Breath, Swelling   Pt has taken other pain medications, including oxycodone without reaction.   Other Anaphylaxis   Ketchup, mustard, mayo   Tomato Anaphylaxis        Medication List     TAKE these medications    Blood Pressure Kit Devi 1 Device by Does not apply route as needed.   famotidine 20 MG tablet Commonly known as: PEPCID Take 1 tablet (20 mg total) by mouth daily for 5 days.   nirmatrelvir/ritonavir EUA 20 x 150 MG & 10 x 100MG Tabs Commonly known as: PAXLOVID Take 3 tablets by mouth 2 (two) times daily for 5 days. Patient GFR is normal. Take nirmatrelvir (150 mg) two tablets twice daily for 5 days and ritonavir (100 mg) one  tablet twice daily for 5 days.   ondansetron 4 MG disintegrating tablet Commonly known as: ZOFRAN-ODT Take 1 tablet (4 mg total) by mouth every 8 (eight) hours as needed for nausea or vomiting.   Prenatal Vitamin and Mineral 28-0.8 MG Tabs Take 1 tablet by mouth daily.   promethazine 25 MG tablet Commonly known as: PHENERGAN Take 1 tablet (25 mg total) by mouth every 6 (six) hours as needed for nausea or vomiting.   traMADol 50 MG tablet Commonly known as:  ULTRAM Take 1-2 tablets (50-100 mg total) by mouth every 6 (six) hours as needed.        Follow-up [place] [facility] Follow up.   Specialty: Obstetrics and Gynecology Why: As scheduled Contact information: [address]  [place] [address] [place] [facility] Follow up.   Specialty: Obstetrics and Gynecology Why: As needed for emergencies Contact information: [address] [phone] [place] Certified Nurse-Midwife 05/11/2021  5:51 AM

## 2021-05-10 NOTE — MAU Note (Signed)
..  Jennifer Bird is a 25 y.o. at [redacted]w[redacted]d here in MAU reporting: chest pain that feels like pressure in mid chest, SOB at rest, N/A with unable to eat and no appetite, nasal congestion, lower back pain that is arching and pressure. Pt states she is taking tylenol and prescribed medication around 1700 but no relief.  Pt states she has drinking cranberry juice and water all day and has been able to keep that down. PT denies fever, HA, VB, LOF, cramping, and abnormal discharge. Pt reports was told to return if s/s worsen.   COVID + 05/07/21  Pain score: Chest 8/10, back 10/10 Vitals:   05/10/21 2134 05/10/21 2135  BP:  121/71  Pulse:  82  Resp:  18  Temp:  98.4 F (36.9 C)  SpO2: 100%      FHT:145 Lab orders placed from triage:  none

## 2021-05-31 ENCOUNTER — Telehealth (INDEPENDENT_AMBULATORY_CARE_PROVIDER_SITE_OTHER): Payer: Medicaid Other | Admitting: Family Medicine

## 2021-05-31 DIAGNOSIS — Z91199 Patient's noncompliance with other medical treatment and regimen due to unspecified reason: Secondary | ICD-10-CM

## 2021-06-02 ENCOUNTER — Ambulatory Visit: Payer: Medicaid Other | Attending: Family Medicine

## 2021-06-02 ENCOUNTER — Other Ambulatory Visit: Payer: Self-pay

## 2021-06-02 ENCOUNTER — Other Ambulatory Visit: Payer: Self-pay | Admitting: *Deleted

## 2021-06-02 ENCOUNTER — Other Ambulatory Visit: Payer: Self-pay | Admitting: Family Medicine

## 2021-06-02 DIAGNOSIS — Z362 Encounter for other antenatal screening follow-up: Secondary | ICD-10-CM

## 2021-06-02 DIAGNOSIS — O321XX0 Maternal care for breech presentation, not applicable or unspecified: Secondary | ICD-10-CM | POA: Diagnosis not present

## 2021-06-02 DIAGNOSIS — Z8616 Personal history of COVID-19: Secondary | ICD-10-CM | POA: Diagnosis not present

## 2021-06-02 DIAGNOSIS — Z3400 Encounter for supervision of normal first pregnancy, unspecified trimester: Secondary | ICD-10-CM | POA: Diagnosis not present

## 2021-06-02 DIAGNOSIS — Z3A19 19 weeks gestation of pregnancy: Secondary | ICD-10-CM | POA: Insufficient documentation

## 2021-06-02 DIAGNOSIS — Z363 Encounter for antenatal screening for malformations: Secondary | ICD-10-CM | POA: Diagnosis present

## 2021-06-07 NOTE — Progress Notes (Signed)
Attempted to connect with patient and unable to do so

## 2021-06-27 ENCOUNTER — Other Ambulatory Visit: Payer: Self-pay

## 2021-06-27 ENCOUNTER — Ambulatory Visit (INDEPENDENT_AMBULATORY_CARE_PROVIDER_SITE_OTHER): Payer: Medicaid Other | Admitting: Family Medicine

## 2021-06-27 VITALS — BP 113/64 | HR 98 | Wt 211.2 lb

## 2021-06-27 DIAGNOSIS — Z2839 Other underimmunization status: Secondary | ICD-10-CM

## 2021-06-27 DIAGNOSIS — O09899 Supervision of other high risk pregnancies, unspecified trimester: Secondary | ICD-10-CM

## 2021-06-27 DIAGNOSIS — Z3402 Encounter for supervision of normal first pregnancy, second trimester: Secondary | ICD-10-CM

## 2021-06-27 NOTE — Patient Instructions (Signed)

## 2021-06-27 NOTE — Progress Notes (Signed)
? ?  Subjective:  ?Jennifer Bird is a 26 y.o. G2P0010 at [redacted]w[redacted]d being seen today for ongoing prenatal care.  She is currently monitored for the following issues for this low-risk pregnancy and has Supervision of low-risk first pregnancy; Rubella non-immune status, antepartum; and COVID-19 affecting pregnancy in second trimester on their problem list. ? ?Patient reports no complaints.  Contractions: Not present. Vag. Bleeding: None.  Movement: Present. Denies leaking of fluid.  ? ?The following portions of the patient's history were reviewed and updated as appropriate: allergies, current medications, past family history, past medical history, past social history, past surgical history and problem list. Problem list updated. ? ?Objective:  ? ?Vitals:  ? 06/27/21 1048  ?BP: 113/64  ?Pulse: 98  ?Weight: 211 lb 3.2 oz (95.8 kg)  ? ? ?Fetal Status: Fetal Heart Rate (bpm): 145   Movement: Present    ? ?General:  Alert, oriented and cooperative. Patient is in no acute distress.  ?Skin: Skin is warm and dry. No rash noted.   ?Cardiovascular: Normal heart rate noted  ?Respiratory: Normal respiratory effort, no problems with respiration noted  ?Abdomen: Soft, gravid, appropriate for gestational age. Pain/Pressure: Present     ?Pelvic: Vag. Bleeding: None     ?Cervical exam deferred        ?Extremities: Normal range of motion.  Edema: None  ?Mental Status: Normal mood and affect. Normal behavior. Normal judgment and thought content.  ? ?Urinalysis:     ? ?Assessment and Plan:  ?Pregnancy: G2P0010 at [redacted]w[redacted]d ? ?1. Encounter for supervision of low-risk first pregnancy in second trimester ?BP and FHR normal ?Discussed fasting for labs next visit ? ?2. Rubella non-immune status, antepartum ?Offer MMR PP ? ?Preterm labor symptoms and general obstetric precautions including but not limited to vaginal bleeding, contractions, leaking of fluid and fetal movement were reviewed in detail with the patient. ?Please refer to After Visit  Summary for other counseling recommendations.  ?Return in 4 weeks (on 07/25/2021) for [facility] patient, ob visit. ? ? ?Clarnce Flock, MD ? ?

## 2021-06-30 ENCOUNTER — Ambulatory Visit: Payer: Medicaid Other | Attending: Obstetrics

## 2021-06-30 ENCOUNTER — Ambulatory Visit: Payer: Medicaid Other | Admitting: *Deleted

## 2021-06-30 ENCOUNTER — Other Ambulatory Visit: Payer: Self-pay

## 2021-06-30 VITALS — BP 114/67 | HR 102

## 2021-06-30 DIAGNOSIS — Z362 Encounter for other antenatal screening follow-up: Secondary | ICD-10-CM | POA: Insufficient documentation

## 2021-06-30 DIAGNOSIS — O321XX0 Maternal care for breech presentation, not applicable or unspecified: Secondary | ICD-10-CM | POA: Insufficient documentation

## 2021-06-30 DIAGNOSIS — Z3402 Encounter for supervision of normal first pregnancy, second trimester: Secondary | ICD-10-CM

## 2021-06-30 DIAGNOSIS — Z3A23 23 weeks gestation of pregnancy: Secondary | ICD-10-CM | POA: Insufficient documentation

## 2021-07-19 ENCOUNTER — Encounter: Payer: Self-pay | Admitting: Family Medicine

## 2021-07-24 ENCOUNTER — Other Ambulatory Visit: Payer: Self-pay

## 2021-07-24 DIAGNOSIS — Z3401 Encounter for supervision of normal first pregnancy, first trimester: Secondary | ICD-10-CM

## 2021-07-31 ENCOUNTER — Ambulatory Visit (INDEPENDENT_AMBULATORY_CARE_PROVIDER_SITE_OTHER): Payer: Medicaid Other | Admitting: Family Medicine

## 2021-07-31 ENCOUNTER — Other Ambulatory Visit: Payer: Medicaid Other

## 2021-07-31 VITALS — BP 117/68 | HR 80 | Wt 218.6 lb

## 2021-07-31 DIAGNOSIS — Z3402 Encounter for supervision of normal first pregnancy, second trimester: Secondary | ICD-10-CM

## 2021-07-31 DIAGNOSIS — O09899 Supervision of other high risk pregnancies, unspecified trimester: Secondary | ICD-10-CM

## 2021-07-31 DIAGNOSIS — Z3401 Encounter for supervision of normal first pregnancy, first trimester: Secondary | ICD-10-CM

## 2021-07-31 DIAGNOSIS — Z2839 Other underimmunization status: Secondary | ICD-10-CM

## 2021-07-31 NOTE — Progress Notes (Signed)
? ? ?  PRENATAL VISIT NOTE ? ?Subjective:  ?Jennifer Bird is a 26 y.o. G2P0010 at [redacted]w[redacted]d being seen today for ongoing prenatal care.  She is currently monitored for the following issues for this low-risk pregnancy and has Supervision of low-risk first pregnancy; Rubella non-immune status, antepartum; and COVID-19 affecting pregnancy in second trimester on their problem list. ? ?Patient reports no complaints.  Contractions: Not present. Vag. Bleeding: None.  Movement: Present. Denies leaking of fluid.  ? ?The following portions of the patient's history were reviewed and updated as appropriate: allergies, current medications, past family history, past medical history, past social history, past surgical history and problem list.  ? ?Objective:  ? ?Vitals:  ? 07/31/21 1057  ?BP: 117/68  ?Pulse: 80  ?Weight: 218 lb 9.6 oz (99.2 kg)  ? ? ?Fetal Status: Fetal Heart Rate (bpm): 155 Fundal Height: 28 cm Movement: Present    ? ?General:  Alert, oriented and cooperative. Patient is in no acute distress.  ?Skin: Skin is warm and dry. No rash noted.   ?Cardiovascular: Normal heart rate noted  ?Respiratory: Normal respiratory effort, no problems with respiration noted  ?Abdomen: Soft, gravid, appropriate for gestational age.  Pain/Pressure: Present     ?Pelvic: Cervical exam deferred        ?Extremities: Normal range of motion.  Edema: None  ?Mental Status: Normal mood and affect. Normal behavior. Normal judgment and thought content.  ? ?Assessment and Plan:  ?Pregnancy: G2P0010 at [redacted]w[redacted]d ?1. Encounter for supervision of low-risk first pregnancy in third trimester ?Doing well ?Wants to go to [employer] ?TWG=16 lb 9.6 oz (7.53 kg)  ?Tdap declined today, desires in the future ? ?Preterm labor symptoms and general obstetric precautions including but not limited to vaginal bleeding, contractions, leaking of fluid and fetal movement were reviewed in detail with the patient. ?Please refer to After Visit Summary for other counseling  recommendations.  ? ?Return in about 4 weeks (around 08/28/2021) for Mom+Baby Combined Care. ? ?Future Appointments  ?Date Time Provider Department Center  ?08/14/2021  4:15 PM Venora Maples, MD [facility]  ?08/29/2021  3:35 PM Venora Maples, MD [facility]  ?09/12/2021  3:55 PM Crissie Reese, Mary Sella, MD [facility]  ? ? ?Federico Flake, MD ? ?

## 2021-08-01 LAB — HIV ANTIBODY (ROUTINE TESTING W REFLEX): HIV Screen 4th Generation wRfx: NONREACTIVE

## 2021-08-01 LAB — CBC
Hematocrit: 33.9 % — ABNORMAL LOW (ref 34.0–46.6)
Hemoglobin: 11.3 g/dL (ref 11.1–15.9)
MCH: 28.1 pg (ref 26.6–33.0)
MCHC: 33.3 g/dL (ref 31.5–35.7)
MCV: 84 fL (ref 79–97)
Platelets: 214 10*3/uL (ref 150–450)
RBC: 4.02 x10E6/uL (ref 3.77–5.28)
RDW: 12.8 % (ref 11.7–15.4)
WBC: 8.8 10*3/uL (ref 3.4–10.8)

## 2021-08-01 LAB — RPR: RPR Ser Ql: NONREACTIVE

## 2021-08-01 LAB — GLUCOSE TOLERANCE, 2 HOURS W/ 1HR
Glucose, 1 hour: 97 mg/dL (ref 70–179)
Glucose, 2 hour: 68 mg/dL — ABNORMAL LOW (ref 70–152)
Glucose, Fasting: 70 mg/dL (ref 70–91)

## 2021-08-02 ENCOUNTER — Encounter: Payer: Self-pay | Admitting: Family Medicine

## 2021-08-02 DIAGNOSIS — R519 Headache, unspecified: Secondary | ICD-10-CM

## 2021-08-02 MED ORDER — CYCLOBENZAPRINE HCL 10 MG PO TABS: 10.0000 mg | ORAL_TABLET | Freq: Three times a day (TID) | ORAL | 1 refills | Status: DC | PRN

## 2021-08-09 ENCOUNTER — Encounter (HOSPITAL_COMMUNITY): Payer: Self-pay | Admitting: Obstetrics and Gynecology

## 2021-08-09 ENCOUNTER — Inpatient Hospital Stay (HOSPITAL_COMMUNITY)
Admission: AD | Admit: 2021-08-09 | Discharge: 2021-08-09 | Disposition: A | Discharge status: Home / Self Care | Payer: Medicaid Other | Attending: Obstetrics and Gynecology | Admitting: Obstetrics and Gynecology

## 2021-08-09 DIAGNOSIS — O212 Late vomiting of pregnancy: Secondary | ICD-10-CM | POA: Insufficient documentation

## 2021-08-09 DIAGNOSIS — Z885 Allergy status to narcotic agent status: Secondary | ICD-10-CM | POA: Insufficient documentation

## 2021-08-09 DIAGNOSIS — R12 Heartburn: Secondary | ICD-10-CM | POA: Insufficient documentation

## 2021-08-09 DIAGNOSIS — Z3A28 28 weeks gestation of pregnancy: Secondary | ICD-10-CM | POA: Insufficient documentation

## 2021-08-09 DIAGNOSIS — O26893 Other specified pregnancy related conditions, third trimester: Secondary | ICD-10-CM | POA: Insufficient documentation

## 2021-08-09 DIAGNOSIS — Z8616 Personal history of COVID-19: Secondary | ICD-10-CM | POA: Diagnosis not present

## 2021-08-09 HISTORY — DX: Urinary tract infection, site not specified: N39.0

## 2021-08-09 HISTORY — DX: Headache, unspecified: R51.9

## 2021-08-09 LAB — URINALYSIS, ROUTINE W REFLEX MICROSCOPIC
Bilirubin Urine: NEGATIVE
Glucose, UA: NEGATIVE mg/dL
Hgb urine dipstick: NEGATIVE
Ketones, ur: NEGATIVE mg/dL
Leukocytes,Ua: NEGATIVE
Nitrite: NEGATIVE
Protein, ur: NEGATIVE mg/dL
Specific Gravity, Urine: 1.02 (ref 1.005–1.030)
pH: 6 (ref 5.0–8.0)

## 2021-08-09 MED ORDER — FAMOTIDINE 20 MG PO TABS
20.0000 mg | ORAL_TABLET | Freq: Every day | ORAL | 0 refills | Status: DC
Start: 2021-08-09 — End: 2021-10-03

## 2021-08-09 NOTE — MAU Provider Note (Signed)
?History  ?  ? ?[id number] ? ?Arrival date and time: 08/09/21 1410 ?  ? ?Chief Complaint  ?Patient presents with  ? Emesis  ? Abdominal Pain  ? Vaginal Bleeding  ? ? ? ?HPI ?Jennifer Bird is a 26 y.o. at [redacted]w[redacted]d by LMP who presents for vomiting & pink spotting. ?States she woke up this morning with nausea & vomited several times. Hasn't vomited since 930 am & nausea has resolved without intervention. Ate lunch & was able to keep it down (soup & sandwich). Ate tacos & fruit snacks last night before going to bed. Reports recent issues with heartburn.  ?Saw pink spotting on toilet paper this morning. Not bleeding into pad. No bleeding since then. Some irregular abdominal cramping.  ?No fever. Denies sick contacts. Denies recent intercourse. Denies dysuria, vaginal discharge, or LOF. Reports good fetal movement.   ? ?OB History   ? ? Gravida  ?2  ? Para  ?0  ? Term  ?0  ? Preterm  ?0  ? AB  ?1  ? Living  ?0  ?  ? ? SAB  ?1  ? IAB  ?0  ? Ectopic  ?0  ? Multiple  ?0  ? Live Births  ?0  ?   ?  ?  ? ? ?Past Medical History:  ?Diagnosis Date  ? Asthma   ? Bronchitis   ? COVID-19 05/07/2021  ? Headache   ? UTI (urinary tract infection)   ? ? ?Past Surgical History:  ?Procedure Laterality Date  ? NO PAST SURGERIES    ? ? ?Family History  ?Problem Relation Age of Onset  ? Hypertension Mother   ? Cirrhosis Father   ? ? ?Allergies  ?Allergen Reactions  ? Codeine Shortness Of Breath and Swelling  ?  Pt has taken other pain medications, including oxycodone without reaction.  ? Other Anaphylaxis  ?  Ketchup, mustard, mayo  ? Tomato Anaphylaxis  ? ? ?No current facility-administered medications on file prior to encounter.  ? ?Current Outpatient Medications on File Prior to Encounter  ?Medication Sig Dispense Refill  ? Prenatal Vit-Fe Fumarate-FA (PRENATAL VITAMIN AND MINERAL) 28-0.8 MG TABS Take 1 tablet by mouth daily. 30 tablet 11  ? cyclobenzaprine (FLEXERIL) 10 MG tablet Take 1 tablet (10 mg total) by mouth every 8 (eight) hours  as needed for muscle spasms. 30 tablet 1  ? famotidine (PEPCID) 20 MG tablet Take 1 tablet (20 mg total) by mouth daily for 5 days. 5 tablet 0  ? [DISCONTINUED] diphenhydrAMINE (BENADRYL) 25 mg capsule Take 1 capsule (25 mg total) by mouth every 6 (six) hours as needed. (Patient not taking: Reported on 07/23/2016) 30 capsule 0  ? ? ? ?ROS ?Pertinent positives and negative per HPI, all others reviewed and negative ? ?Physical Exam  ? ?BP 113/71 (BP Location: Right Arm)   Pulse 91   Temp 98.2 ?F (36.8 ?C) (Oral)   Resp 16   Ht 5\' 9"  (1.753 m)   Wt 98.7 kg   LMP 01/20/2021 (Within Days)   SpO2 100%   BMI 32.12 kg/m?  ? ?Patient Vitals for the past 24 hrs: ? BP Temp Temp src Pulse Resp SpO2 Height Weight  ?08/09/21 1435 -- -- -- -- -- 100 % -- --  ?08/09/21 1433 113/71 98.2 ?F (36.8 ?C) Oral 91 16 -- -- --  ?08/09/21 1424 -- -- -- -- -- -- 5\' 9"  (1.753 m) 98.7 kg  ? ? ?Physical Exam ?Vitals and nursing note reviewed.  Exam conducted with a chaperone present.  ?Constitutional:   ?   General: She is not in acute distress. ?   Appearance: She is well-developed.  ?HENT:  ?   Head: Normocephalic and atraumatic.  ?Eyes:  ?   General: No scleral icterus. ?   Pupils: Pupils are equal, round, and reactive to light.  ?Pulmonary:  ?   Effort: Pulmonary effort is normal. No respiratory distress.  ?Abdominal:  ?   Palpations: Abdomen is soft.  ?   Tenderness: There is no abdominal tenderness.  ?Genitourinary: ?   Comments: No blood.  ?Neurological:  ?   Mental Status: She is alert.  ?  ? ?Cervical Exam ?Dilation: Closed ?Effacement (%): Thick ?Cervical Position: Posterior ?Exam by:: Lyman Bishop NP ? ? ?FHT ?Baseline 145, moderate variability, 15x15 accels, no decels ?Toco: none ?Cat: 1 ? ?Labs ?Results for orders placed or performed during the hospital encounter of 08/09/21 (from the past 24 hour(s))  ?Urinalysis, Routine w reflex microscopic Urine, Clean Catch     Status: None  ? Collection Time: 08/09/21  2:53 PM  ?Result Value  Ref Range  ? Color, Urine YELLOW YELLOW  ? APPearance CLEAR CLEAR  ? Specific Gravity, Urine 1.020 1.005 - 1.030  ? pH 6.0 5.0 - 8.0  ? Glucose, UA NEGATIVE NEGATIVE mg/dL  ? Hgb urine dipstick NEGATIVE NEGATIVE  ? Bilirubin Urine NEGATIVE NEGATIVE  ? Ketones, ur NEGATIVE NEGATIVE mg/dL  ? Protein, ur NEGATIVE NEGATIVE mg/dL  ? Nitrite NEGATIVE NEGATIVE  ? Leukocytes,Ua NEGATIVE NEGATIVE  ? ? ?Imaging ?No results found. ? ?MAU Course  ?Procedures ?Lab Orders    ?     Urinalysis, Routine w reflex microscopic Urine, Clean Catch    ?Meds ordered this encounter  ?Medications  ? famotidine (PEPCID) 20 MG tablet  ?  Sig: Take 1 tablet (20 mg total) by mouth daily.  ?  Dispense:  30 tablet  ?  Refill:  0  ?  Order Specific Question:   Supervising Provider  ?  Answer:   Warden Fillers [[id number]]  ? ?Imaging Orders  ?No imaging studies ordered today  ? ? ?MDM ?N/v has resolved prior to coming to MAU. Suspect symptoms related to new onset heartburn that has not been treated. Will trial pepcid.  ? ?No vaginal bleeding on exam. Cervix closed/thick.  ?RH positive.  ? ?U/a results reviewed - negative ?Assessment and Plan  ? ?1. Heartburn during pregnancy in third trimester   ?2. [redacted] weeks gestation of pregnancy   ? ?-Rx pepcid ?-Reviewed reasons to return to MAU ? ?Judeth Horn, NP ?08/09/21 ?3:33 PM ? ? ?

## 2021-08-09 NOTE — MAU Note (Addendum)
Jennifer Bird is a 26 y.o. at [redacted]w[redacted]d here in MAU reporting: woke up this morning and started throwing up, continued until about 0930.  Ate soup and grilled cheese for lunch, kept that down. Noted pink spotting when wiped one time,none since, no recent intercourse.  Has had some mild cramps, random, not everyday.  ?Onset of complaint: this morning ?Pain score: mild ?Vitals:  ? 08/09/21 1433 08/09/21 1435  ?BP: 113/71   ?Pulse: 91   ?Resp: 16   ?Temp: 98.2 ?F (36.8 ?C)   ?SpO2:  100%  ?   ?FHT:150 ?Lab orders placed from triage:  urine ?

## 2021-08-10 ENCOUNTER — Encounter: Payer: Self-pay | Admitting: Family Medicine

## 2021-08-14 ENCOUNTER — Encounter: Payer: Self-pay | Admitting: Family Medicine

## 2021-08-14 ENCOUNTER — Telehealth (INDEPENDENT_AMBULATORY_CARE_PROVIDER_SITE_OTHER): Payer: Medicaid Other | Admitting: Family Medicine

## 2021-08-14 DIAGNOSIS — Z2839 Other underimmunization status: Secondary | ICD-10-CM

## 2021-08-14 DIAGNOSIS — Z3402 Encounter for supervision of normal first pregnancy, second trimester: Secondary | ICD-10-CM

## 2021-08-14 DIAGNOSIS — Z3A29 29 weeks gestation of pregnancy: Secondary | ICD-10-CM

## 2021-08-14 DIAGNOSIS — O09899 Supervision of other high risk pregnancies, unspecified trimester: Secondary | ICD-10-CM

## 2021-08-14 NOTE — Progress Notes (Signed)
I connected with Jennifer Bird 08/14/21 at  4:15 PM EDT by: MyChart video and verified that I am speaking with the correct person using two identifiers.  Patient is located at home and provider is located at [facility].   ?  ?I discussed the limitations, risks, security and privacy concerns of performing an evaluation and management service by MyChart video and the availability of in person appointments. I also discussed with the patient that there may be a patient responsible charge related to this service. By engaging in this virtual visit, you consent to the provision of healthcare.  Additionally, you authorize for your insurance to be billed for the services provided during this visit.  The patient expressed understanding and agreed to proceed. ? ?The following staff members participated in the virtual visit:  Clarnce Flock, MD/MPH ?Attending Family Medicine Physician, Faculty Practice ?[facility] [place] ? ? ? ? ? ?PRENATAL VISIT NOTE ? ?Subjective:  ?Jennifer Bird is a 26 y.o. G2P0010 at [redacted]w[redacted]d  for virtual visit for ongoing prenatal care.  She is currently monitored for the following issues for this low-risk pregnancy and has Supervision of low-risk first pregnancy; Rubella non-immune status, antepartum; and COVID-19 affecting pregnancy in second trimester on their problem list. ? ?Patient reports no complaints.  Contractions: Not present. Vag. Bleeding: None.  Movement: Present. Denies leaking of fluid.  ? ?The following portions of the patient's history were reviewed and updated as appropriate: allergies, current medications, past family history, past medical history, past social history, past surgical history and problem list.  ? ?Objective:  ?There were no vitals filed for this visit. Self-Obtained ? ?Fetal Status:     Movement: Present    ? ?Assessment and Plan:  ?Pregnancy: G2P0010 at [redacted]w[redacted]d ?1. Encounter for supervision of low-risk first pregnancy in  second trimester ?Good fetal movements ?Third trimester labs normal ?Needs Tdap at next visit ? ?2. Rubella non-immune status, antepartum ?Offer MMR PP ? ?Preterm labor symptoms and general obstetric precautions including but not limited to vaginal bleeding, contractions, leaking of fluid and fetal movement were reviewed in detail with the patient. ? ?Return in about 2 weeks (around 08/28/2021) for Dyad patient, ob visit. ? ?Future Appointments  ?Date Time Provider [place]  ?08/29/2021  3:35 PM Dione Plover, Annice Needy, MD [facility]  ?09/12/2021  3:55 PM Dione Plover, Annice Needy, MD [facility]  ? ? ? ?Time spent on virtual visit: 10 minutes ? ?Clarnce Flock, MD ? ?

## 2021-08-23 ENCOUNTER — Inpatient Hospital Stay (HOSPITAL_COMMUNITY)
Admission: AD | Admit: 2021-08-23 | Discharge: 2021-08-23 | Disposition: A | Discharge status: Home / Self Care | Payer: Medicaid Other | Attending: Obstetrics & Gynecology | Admitting: Obstetrics & Gynecology

## 2021-08-23 ENCOUNTER — Encounter (HOSPITAL_COMMUNITY): Payer: Self-pay | Admitting: Obstetrics & Gynecology

## 2021-08-23 DIAGNOSIS — Z3A30 30 weeks gestation of pregnancy: Secondary | ICD-10-CM | POA: Insufficient documentation

## 2021-08-23 DIAGNOSIS — R55 Syncope and collapse: Secondary | ICD-10-CM | POA: Diagnosis not present

## 2021-08-23 DIAGNOSIS — R42 Dizziness and giddiness: Secondary | ICD-10-CM | POA: Insufficient documentation

## 2021-08-23 DIAGNOSIS — R109 Unspecified abdominal pain: Secondary | ICD-10-CM

## 2021-08-23 DIAGNOSIS — O26893 Other specified pregnancy related conditions, third trimester: Secondary | ICD-10-CM | POA: Diagnosis not present

## 2021-08-23 DIAGNOSIS — Z87440 Personal history of urinary (tract) infections: Secondary | ICD-10-CM | POA: Insufficient documentation

## 2021-08-23 DIAGNOSIS — R1030 Lower abdominal pain, unspecified: Secondary | ICD-10-CM | POA: Insufficient documentation

## 2021-08-23 DIAGNOSIS — Z8616 Personal history of COVID-19: Secondary | ICD-10-CM | POA: Insufficient documentation

## 2021-08-23 DIAGNOSIS — Z885 Allergy status to narcotic agent status: Secondary | ICD-10-CM | POA: Insufficient documentation

## 2021-08-23 LAB — URINALYSIS, ROUTINE W REFLEX MICROSCOPIC
Bilirubin Urine: NEGATIVE
Glucose, UA: NEGATIVE mg/dL
Hgb urine dipstick: NEGATIVE
Ketones, ur: NEGATIVE mg/dL
Nitrite: NEGATIVE
Protein, ur: NEGATIVE mg/dL
Specific Gravity, Urine: 1.01 (ref 1.005–1.030)
pH: 6 (ref 5.0–8.0)

## 2021-08-23 LAB — COMPREHENSIVE METABOLIC PANEL
ALT: 17 U/L (ref 0–44)
AST: 18 U/L (ref 15–41)
Albumin: 2.9 g/dL — ABNORMAL LOW (ref 3.5–5.0)
Alkaline Phosphatase: 81 U/L (ref 38–126)
Anion gap: 8 (ref 5–15)
BUN: <5 mg/dL — ABNORMAL LOW (ref 6–20)
CO2: 23 mmol/L (ref 22–32)
Calcium: 9 mg/dL (ref 8.9–10.3)
Chloride: 105 mmol/L (ref 98–111)
Creatinine, Ser: 0.62 mg/dL (ref 0.44–1.00)
GFR, Estimated: >60 mL/min (ref >60)
Glucose, Bld: 77 mg/dL (ref 70–99)
Potassium: 3.6 mmol/L (ref 3.5–5.1)
Sodium: 136 mmol/L (ref 135–145)
Total Bilirubin: 0.5 mg/dL (ref 0.3–1.2)
Total Protein: 5.9 g/dL — ABNORMAL LOW (ref 6.5–8.1)

## 2021-08-23 LAB — CBC
HCT: 31.3 % — ABNORMAL LOW (ref 36.0–46.0)
Hemoglobin: 10.5 g/dL — ABNORMAL LOW (ref 12.0–15.0)
MCH: 28.3 pg (ref 26.0–34.0)
MCHC: 33.5 g/dL (ref 30.0–36.0)
MCV: 84.4 fL (ref 80.0–100.0)
Platelets: 196 10*3/uL (ref 150–400)
RBC: 3.71 MIL/uL — ABNORMAL LOW (ref 3.87–5.11)
RDW: 12.5 % (ref 11.5–15.5)
WBC: 8.5 10*3/uL (ref 4.0–10.5)
nRBC: 0 % (ref 0.0–0.2)

## 2021-08-23 MED ORDER — COMFORT FIT MATERNITY SUPP MED MISC: 1.0000 | Freq: Every day | 0 refills | Status: DC

## 2021-08-23 NOTE — MAU Provider Note (Signed)
Chief Complaint:  Abdominal Pain, leg cramps, and Hot Flashes ? ? Event Date/Time  ? First Provider Initiated Contact with Patient 08/23/21 1910   ?  ? ?HPI: Jennifer Bird is a 26 y.o. G2P0010 at [redacted]w[redacted]d by LMP who presents to maternity admissions reporting onset of cramping in her low abdomen and upper thighs last night that worsened today. She was able to sleep last night with mild cramping and went to work this morning. While standing at work she started to feel hot and flushed and sat down to rest. After this episode, she started having stronger cramps in her abdomen and legs.  She reports she drank water yesterday, but not much today. She did not eat breakfast before going to work this morning. ? ? ?She reports good fetal movement. ? ? ?HPI ? ?Past Medical History: ?Past Medical History:  ?Diagnosis Date  ? Asthma   ? Bronchitis   ? COVID-19 05/07/2021  ? Headache   ? UTI (urinary tract infection)   ? ? ?Past obstetric history: ?OB History  ?Gravida Para Term Preterm AB Living  ?2 0 0 0 1 0  ?SAB IAB Ectopic Multiple Live Births  ?1 0 0 0 0  ?  ?# Outcome Date GA Lbr Len/2nd Weight Sex Delivery Anes PTL Lv  ?2 Current           ?1 SAB 2016          ? ? ?Past Surgical History: ?Past Surgical History:  ?Procedure Laterality Date  ? NO PAST SURGERIES    ? ? ?Family History: ?Family History  ?Problem Relation Age of Onset  ? Hypertension Mother   ? Cirrhosis Father   ? ? ?Social History: ?Social History  ? ?Tobacco Use  ? Smoking status: Never  ? Smokeless tobacco: Never  ?Vaping Use  ? Vaping Use: Never used  ?Substance Use Topics  ? Alcohol use: Not Currently  ?  Alcohol/week: 1.0 standard drink  ?  Types: 1 Glasses of wine per week  ?  Comment: not whwile preg  ? Drug use: No  ? ? ?Allergies:  ?Allergies  ?Allergen Reactions  ? Codeine Shortness Of Breath and Swelling  ?  Pt has taken other pain medications, including oxycodone without reaction.  ? Tomato Anaphylaxis  ? ? ?Meds:  ?No medications prior to  admission.  ? ? ?ROS:  ?Review of Systems ? ? ?I have reviewed patient's Past Medical Hx, Surgical Hx, Family Hx, Social Hx, medications and allergies.  ? ?Physical Exam  ?Patient Vitals for the past 24 hrs: ? BP Temp Temp src Pulse Resp SpO2 Height Weight  ?08/23/21 1829 105/64 98.5 ?F (36.9 ?C) Oral 92 20 100 % 5\' 9"  (1.753 m) 101.7 kg  ? ?Constitutional: Well-developed, well-nourished female in no acute distress.  ?Cardiovascular: normal rate ?Respiratory: normal effort ?GI: Abd soft, non-tender, gravid appropriate for gestational age.  ?MS: Extremities nontender, no edema, normal ROM ?Neurologic: Alert and oriented x 4.  ?GU: Neg CVAT. ? ?PELVIC EXAM: Cervix pink, visually closed, without lesion, scant white creamy discharge, vaginal walls and external genitalia normal ?Bimanual exam: Cervix 0/long/high, firm, anterior, neg CMT, uterus nontender, nonenlarged, adnexa without tenderness, enlargement, or mass ? ?Dilation: Closed ?Effacement (%): Thick ?Cervical Position: Posterior ?Exam by:: L.Leftwich-Kirby,CNM ? ?FHT:  Baseline 135, moderate variability, accelerations present, no decelerations ?Contractions: rare, mild to palpation ?  ?Labs: ?Results for orders placed or performed during the hospital encounter of 08/23/21 (from the past 24 hour(s))  ?Urinalysis,  Routine w reflex microscopic Urine, Clean Catch     Status: Abnormal  ? Collection Time: 08/23/21  6:39 PM  ?Result Value Ref Range  ? Color, Urine YELLOW YELLOW  ? APPearance CLEAR CLEAR  ? Specific Gravity, Urine 1.010 1.005 - 1.030  ? pH 6.0 5.0 - 8.0  ? Glucose, UA NEGATIVE NEGATIVE mg/dL  ? Hgb urine dipstick NEGATIVE NEGATIVE  ? Bilirubin Urine NEGATIVE NEGATIVE  ? Ketones, ur NEGATIVE NEGATIVE mg/dL  ? Protein, ur NEGATIVE NEGATIVE mg/dL  ? Nitrite NEGATIVE NEGATIVE  ? Leukocytes,Ua TRACE (A) NEGATIVE  ? RBC / HPF 0-5 0 - 5 RBC/hpf  ? WBC, UA 0-5 0 - 5 WBC/hpf  ? Bacteria, UA RARE (A) NONE SEEN  ? Squamous Epithelial / LPF 0-5 0 - 5   ? ?B/Positive/-- (12/20 8299) ? ?Imaging:  ?No results found. ? ?MAU Course/MDM: ?Orders Placed This Encounter  ?Procedures  ? Urinalysis, Routine w reflex microscopic Urine, Clean Catch  ? CBC  ? Comprehensive metabolic panel  ? Discharge patient  ?  ?Meds ordered this encounter  ?Medications  ? Elastic Bandages & Supports (COMFORT FIT MATERNITY SUPP MED) MISC  ?  Sig: 1 Device by Does not apply route daily.  ?  Dispense:  1 each  ?  Refill:  0  ?  Order Specific Question:   Supervising Provider  ?  Answer:   Jaynie Collins A [[id number]]  ?  ? ?NST reviewed and reactive ?Cervix closed/thick/high, no evidence of preterm labor ?CBC and CMP pending at time of discharge but no hx anemia with Hgb 11.3 on 07/31/21 ?Pt encouraged to eat regularly, increase protein in foods, and drink plenty of water ?PTL precautions/reasons to return to MAU reviewed ?Keep scheduled appt with [facility] ? ? ? ?Assessment: ?1. Abdominal pain during pregnancy, third trimester   ?2. Postural dizziness with near syncope   ?3. [redacted] weeks gestation of pregnancy   ? ? ?Plan: ?Discharge home ?Labor precautions and fetal kick counts ? Follow-up Information   ? ? [facility] Follow up.   ?Specialty: Obstetrics and Gynecology ?Why: As scheduled ?Contact information: ?[address] ?[place] [address] ?[phone] ? ?  ?  ? ? [facility] Follow up.   ?Specialty: Obstetrics and Gynecology ?Contact information: ?[address] ?[id number] mc ?[place] [address] ?[phone] ? ?  ?  ? ?  ?  ? ?  ? ?Allergies as of 08/23/2021   ? ?   Reactions  ? Codeine Shortness Of Breath, Swelling  ? Pt has taken other pain medications, including oxycodone without reaction.  ? Tomato Anaphylaxis  ? ?  ? ?  ?Medication List  ?  ? ?TAKE these medications   ? ?Comfort Fit Maternity Supp Med Misc ?1 Device by Does not apply route daily. ?  ?cyclobenzaprine 10 MG  tablet ?Commonly known as: FLEXERIL ?Take 10 mg by mouth 3 (three) times daily as needed for muscle spasms. ?  ?famotidine 20 MG tablet ?Commonly known as: Pepcid ?Take 1 tablet (20 mg total) by mouth daily. ?  ?Prenatal Vitamin and Mineral 28-0.8 MG Tabs ?Take 1 tablet by mouth daily. ?  ? ?  ? ? ?Misty Stanley Leftwich-Kirby ?Certified Nurse-Midwife ?08/23/2021 ?7:58 PM  ?

## 2021-08-23 NOTE — MAU Note (Signed)
Jennifer Bird is a 26 y.o. at [redacted]w[redacted]d here in MAU reporting: started cramping in lower abd last night.  Was able to get some sleep.  Went on to work today.  Stomach started hurting again, then in her upper legs. Hasn't stopped.  No bleeding ,no LOF. +FM reported.    Having loose stools, started last night.  Threw up x1 earlier with first hot flash. . Kept sweating, "hot flashes" ?Onset of complaint: last night ?Pain score: 8-abd;8 legs ?Vitals:  ? 08/23/21 1829  ?BP: 105/64  ?Pulse: 92  ?Resp: 20  ?Temp: 98.5 ?F (36.9 ?C)  ?SpO2: 100%  ?   ?FHT:155 ?Lab orders placed from triage:  urine ?

## 2021-08-29 ENCOUNTER — Ambulatory Visit (INDEPENDENT_AMBULATORY_CARE_PROVIDER_SITE_OTHER): Payer: Medicaid Other | Admitting: Family Medicine

## 2021-08-29 ENCOUNTER — Encounter: Payer: Self-pay | Admitting: Family Medicine

## 2021-08-29 VITALS — BP 115/76 | HR 105 | Wt 221.8 lb

## 2021-08-29 DIAGNOSIS — O09899 Supervision of other high risk pregnancies, unspecified trimester: Secondary | ICD-10-CM

## 2021-08-29 DIAGNOSIS — Z2839 Other underimmunization status: Secondary | ICD-10-CM

## 2021-08-29 DIAGNOSIS — Z3402 Encounter for supervision of normal first pregnancy, second trimester: Secondary | ICD-10-CM | POA: Diagnosis not present

## 2021-08-29 DIAGNOSIS — Z23 Encounter for immunization: Secondary | ICD-10-CM | POA: Diagnosis not present

## 2021-08-29 NOTE — Patient Instructions (Signed)

## 2021-08-29 NOTE — Progress Notes (Signed)
? ?  Subjective:  ?Jennifer Bird is a 26 y.o. G2P0010 at [redacted]w[redacted]d being seen today for ongoing prenatal care.  She is currently monitored for the following issues for this low-risk pregnancy and has Supervision of low-risk first pregnancy; Rubella non-immune status, antepartum; and COVID-19 affecting pregnancy in second trimester on their problem list. ? ?Patient reports no complaints.  Contractions: Irritability. Vag. Bleeding: None.  Movement: Present. Denies leaking of fluid.  ? ?The following portions of the patient's history were reviewed and updated as appropriate: allergies, current medications, past family history, past medical history, past social history, past surgical history and problem list. Problem list updated. ? ?Objective:  ? ?Vitals:  ? 08/29/21 1542  ?BP: 115/76  ?Pulse: (!) 105  ?Weight: 221 lb 12.8 oz (100.6 kg)  ? ? ?Fetal Status: Fetal Heart Rate (bpm): 143   Movement: Present    ? ?General:  Alert, oriented and cooperative. Patient is in no acute distress.  ?Skin: Skin is warm and dry. No rash noted.   ?Cardiovascular: Normal heart rate noted  ?Respiratory: Normal respiratory effort, no problems with respiration noted  ?Abdomen: Soft, gravid, appropriate for gestational age. Pain/Pressure: Absent     ?Pelvic: Vag. Bleeding: None     ?Cervical exam deferred        ?Extremities: Normal range of motion.  Edema: None  ?Mental Status: Normal mood and affect. Normal behavior. Normal judgment and thought content.  ? ?Urinalysis:     ? ?Assessment and Plan:  ?Pregnancy: G2P0010 at [redacted]w[redacted]d ? ?1. Encounter for supervision of low-risk first pregnancy in second trimester ?BP and FHR normal ?TDAP given today ?Discussed contraception, interested in hormonal IUD post placentally ?- Tdap vaccine greater than or equal to 7yo IM ? ?2. Rubella non-immune status, antepartum ?Offer MMR PP ? ?Preterm labor symptoms and general obstetric precautions including but not limited to vaginal bleeding, contractions, leaking  of fluid and fetal movement were reviewed in detail with the patient. ?Please refer to After Visit Summary for other counseling recommendations.  ?Return in 2 weeks (on 09/12/2021) for Dyad patient, ob visit. ? ? ?Clarnce Flock, MD ? ?

## 2021-09-12 ENCOUNTER — Encounter: Payer: Medicaid Other | Admitting: Family Medicine

## 2021-09-17 ENCOUNTER — Other Ambulatory Visit: Payer: Self-pay

## 2021-09-17 ENCOUNTER — Inpatient Hospital Stay (HOSPITAL_COMMUNITY)
Admission: AD | Admit: 2021-09-17 | Discharge: 2021-09-17 | Disposition: A | Discharge status: Home / Self Care | Payer: Medicaid Other | Attending: Obstetrics and Gynecology | Admitting: Obstetrics and Gynecology

## 2021-09-17 ENCOUNTER — Encounter (HOSPITAL_COMMUNITY): Payer: Self-pay | Admitting: Obstetrics and Gynecology

## 2021-09-17 DIAGNOSIS — Z20822 Contact with and (suspected) exposure to covid-19: Secondary | ICD-10-CM | POA: Insufficient documentation

## 2021-09-17 DIAGNOSIS — O26893 Other specified pregnancy related conditions, third trimester: Secondary | ICD-10-CM | POA: Diagnosis present

## 2021-09-17 DIAGNOSIS — J019 Acute sinusitis, unspecified: Secondary | ICD-10-CM | POA: Insufficient documentation

## 2021-09-17 DIAGNOSIS — Z3A34 34 weeks gestation of pregnancy: Secondary | ICD-10-CM | POA: Insufficient documentation

## 2021-09-17 DIAGNOSIS — O212 Late vomiting of pregnancy: Secondary | ICD-10-CM | POA: Diagnosis not present

## 2021-09-17 DIAGNOSIS — J018 Other acute sinusitis: Secondary | ICD-10-CM | POA: Diagnosis not present

## 2021-09-17 DIAGNOSIS — O99513 Diseases of the respiratory system complicating pregnancy, third trimester: Secondary | ICD-10-CM | POA: Insufficient documentation

## 2021-09-17 DIAGNOSIS — Z3689 Encounter for other specified antenatal screening: Secondary | ICD-10-CM

## 2021-09-17 LAB — URINALYSIS, ROUTINE W REFLEX MICROSCOPIC
Bilirubin Urine: NEGATIVE
Glucose, UA: NEGATIVE mg/dL
Hgb urine dipstick: NEGATIVE
Ketones, ur: NEGATIVE mg/dL
Leukocytes,Ua: NEGATIVE
Nitrite: NEGATIVE
Protein, ur: NEGATIVE mg/dL
Specific Gravity, Urine: 1.015 (ref 1.005–1.030)
pH: 7 (ref 5.0–8.0)

## 2021-09-17 LAB — RESP PANEL BY RT-PCR (FLU A&B, COVID) ARPGX2
Influenza A by PCR: NEGATIVE
Influenza B by PCR: NEGATIVE
SARS Coronavirus 2 by RT PCR: NEGATIVE

## 2021-09-17 MED ORDER — SALINE SPRAY 0.65 % NA SOLN: 1.0000 | NASAL | 0 refills | Status: DC | PRN

## 2021-09-17 MED ORDER — PSEUDOEPHEDRINE HCL 30 MG PO TABS
60.0000 mg | ORAL_TABLET | Freq: Once | ORAL | Status: AC
Start: 2021-09-17 — End: 2021-09-17
  Administered 2021-09-17: 60 mg via ORAL
  Filled 2021-09-17: qty 2

## 2021-09-17 MED ORDER — PSEUDOEPHEDRINE HCL 30 MG PO TABS: 30.0000 mg | ORAL_TABLET | ORAL | 0 refills | Status: DC | PRN

## 2021-09-17 NOTE — MAU Note (Signed)
.  Jennifer Bird is a 26 y.o. at [redacted]w[redacted]d here in MAU reporting: for 2 days patient has been experiencing productive cough with clear sputum. Denies fever, chills or generalized body aches. Pt also reports nausea and vomiting with solid food-she is able to tolerate liquids . Pt denies pregnancy concerns, denies contractions or cramping, SROM, vaginal bleeding or bloody show. Endorses + fetal movement.  Onset of complaint: Friday Pain score: headache-frontal - 8 on pain scale Vitals:   09/17/21 2157  BP: 113/64  Pulse: 97  Resp: 18  Temp: 98.8 F (37.1 C)  SpO2: 99%     FHT:145bpm Lab orders placed from triage:  UA

## 2021-09-17 NOTE — MAU Provider Note (Addendum)
History     CSN: [id number]  Arrival date and time: 09/17/21 2140   Event Date/Time   First Provider Initiated Contact with Patient 09/17/21 2245      Chief Complaint  Patient presents with   Cough   Headache   Nausea   Emesis   Jennifer Bird is a 26 y.o. G2P0010 at [redacted]w[redacted]d who receives care at [facility].  She presents today for Cough, Nausea, and Vomiting.  She states she had "a head cold a couple of years ago" and these symptoms are similar. She denies fever or chills.  She denies a history of allergies, but states "most of my siblings have it."  She reports she is also experiencing vomiting with eating.  She states she can keep down liquids, but throws up after each meal since last night.  She reports clear mucous with cough (occasionally) and when she blows her nose.  She endorses fetal movement and denies abdominal pain or contractions.     OB History     Gravida  2   Para  0   Term  0   Preterm  0   AB  1   Living  0      SAB  1   IAB  0   Ectopic  0   Multiple  0   Live Births  0           Past Medical History:  Diagnosis Date   Asthma    Bronchitis    COVID-19 05/07/2021   Headache    UTI (urinary tract infection)     Past Surgical History:  Procedure Laterality Date   NO PAST SURGERIES      Family History  Problem Relation Age of Onset   Hypertension Mother    Cirrhosis Father     Social History   Tobacco Use   Smoking status: Never   Smokeless tobacco: Never  Vaping Use   Vaping Use: Never used  Substance Use Topics   Alcohol use: Not Currently    Alcohol/week: 1.0 standard drink    Types: 1 Glasses of wine per week    Comment: not whwile preg   Drug use: No    Allergies:  Allergies  Allergen Reactions   Codeine Shortness Of Breath and Swelling    Pt has taken other pain medications, including oxycodone without reaction.   Tomato Anaphylaxis    Medications Prior to Admission  Medication Sig Dispense Refill Last Dose    Prenatal Vit-Fe Fumarate-FA (PRENATAL VITAMIN AND MINERAL) 28-0.8 MG TABS Take 1 tablet by mouth daily. 30 tablet 11 09/17/2021   cyclobenzaprine (FLEXERIL) 10 MG tablet Take 10 mg by mouth 3 (three) times daily as needed for muscle spasms.      Elastic Bandages & Supports (COMFORT FIT MATERNITY SUPP MED) MISC 1 Device by Does not apply route daily. 1 each 0    famotidine (PEPCID) 20 MG tablet Take 1 tablet (20 mg total) by mouth daily. 30 tablet 0     Review of Systems  Constitutional:  Negative for chills and fever.  HENT:  Positive for congestion, rhinorrhea, sinus pressure, sinus pain and sore throat.   Respiratory:  Positive for cough. Negative for chest tightness and shortness of breath.   Cardiovascular:  Negative for chest pain.  Gastrointestinal:  Positive for nausea (None currently). Negative for abdominal pain and vomiting.  Genitourinary:  Negative for difficulty urinating, dysuria, vaginal bleeding and vaginal discharge.  Neurological:  Positive for headaches.  Negative for dizziness and light-headedness.  Physical Exam   Blood pressure 113/64, pulse 97, temperature 98.8 F (37.1 C), temperature source Oral, resp. rate 18, height 5\' 9"  (1.753 m), weight 101.6 kg, last menstrual period 01/20/2021, SpO2 99 %, unknown if currently breastfeeding.  Physical Exam Constitutional:      Appearance: She is well-developed.  HENT:     Head: Normocephalic and atraumatic.     Nose:     Right Turbinates: Swollen.     Left Turbinates: Not swollen.     Right Sinus: Maxillary sinus tenderness and frontal sinus tenderness present.     Left Sinus: Frontal sinus tenderness present. No maxillary sinus tenderness.     Mouth/Throat:     Mouth: Mucous membranes are moist. No oral lesions.     Pharynx: Oropharynx is clear. No pharyngeal swelling or posterior oropharyngeal erythema.     Tonsils: 0 on the right. 1+ on the left.  Neurological:     Mental Status: She is alert.    Fetal  Assessment 145 bpm, Mod Var, -Decels, +15x15 Accels Toco: No ctx graphed  MAU Course   Results for orders placed or performed during the hospital encounter of 09/17/21 (from the past 24 hour(s))  Urinalysis, Routine w reflex microscopic Urine, Clean Catch     Status: None   Collection Time: 09/17/21 10:14 PM  Result Value Ref Range   Color, Urine YELLOW YELLOW   APPearance CLEAR CLEAR   Specific Gravity, Urine 1.015 1.005 - 1.030   pH 7.0 5.0 - 8.0   Glucose, UA NEGATIVE NEGATIVE mg/dL   Hgb urine dipstick NEGATIVE NEGATIVE   Bilirubin Urine NEGATIVE NEGATIVE   Ketones, ur NEGATIVE NEGATIVE mg/dL   Protein, ur NEGATIVE NEGATIVE mg/dL   Nitrite NEGATIVE NEGATIVE   Leukocytes,Ua NEGATIVE NEGATIVE  Resp Panel by RT-PCR (Flu A&B, Covid) Anterior Nasal Swab     Status: None   Collection Time: 09/17/21 10:20 PM   Specimen: Anterior Nasal Swab  Result Value Ref Range   SARS Coronavirus 2 by RT PCR NEGATIVE NEGATIVE   Influenza A by PCR NEGATIVE NEGATIVE   Influenza B by PCR NEGATIVE NEGATIVE   No results found.  MDM PE Labs: UA, Respiratory Panel EFM Prescriptions Assessment and Plan  26 year old G2P0010  SIUP at 34.2 weeks Cat I FT Acute Sinuitis    -Exam performed and findings discussed. -Informed that findings c/w sinus infection, but likely viral. -Discussed usage of OTC medications for symptom management. -Patient offered and accepts sudafed to be given now and for home use. Rx sent. -Also will send saline nasal spray for increased comfort.  -Given list of pregnancy safe medications. -Return precautions reviewed. -NST reactive. -Encouraged to call primary office or return to MAU if symptoms worsen or with the onset of new symptoms. -Discharged to home in stable condition.  30 MSN, CNM 09/17/2021, 10:46 PM

## 2021-09-17 NOTE — Discharge Instructions (Signed)

## 2021-09-17 NOTE — MAU Note (Signed)
Patient being discharged in stable condition. Patient given a list of safe meds to take OTC during pregnancy. Patient verbalized understanding of instructions. AVS was printed and signed.

## 2021-09-26 ENCOUNTER — Encounter: Payer: Medicaid Other | Admitting: Family Medicine

## 2021-10-03 ENCOUNTER — Encounter: Payer: Self-pay | Admitting: Family Medicine

## 2021-10-03 ENCOUNTER — Telehealth (INDEPENDENT_AMBULATORY_CARE_PROVIDER_SITE_OTHER): Payer: Medicaid Other | Admitting: Family Medicine

## 2021-10-03 DIAGNOSIS — T7840XA Allergy, unspecified, initial encounter: Secondary | ICD-10-CM | POA: Insufficient documentation

## 2021-10-03 DIAGNOSIS — Z3403 Encounter for supervision of normal first pregnancy, third trimester: Secondary | ICD-10-CM

## 2021-10-03 DIAGNOSIS — Z2839 Other underimmunization status: Secondary | ICD-10-CM

## 2021-10-03 DIAGNOSIS — R0981 Nasal congestion: Secondary | ICD-10-CM

## 2021-10-03 DIAGNOSIS — Z3A36 36 weeks gestation of pregnancy: Secondary | ICD-10-CM

## 2021-10-03 MED ORDER — FLUTICASONE PROPIONATE 50 MCG/ACT NA SUSP: 1.0000 | Freq: Every day | NASAL | 2 refills | Status: DC

## 2021-10-03 MED ORDER — LORATADINE 10 MG PO TABS: 10.0000 mg | ORAL_TABLET | Freq: Every day | ORAL | 5 refills | Status: DC

## 2021-10-03 NOTE — Patient Instructions (Signed)

## 2021-10-03 NOTE — Progress Notes (Signed)
I connected with Jennifer Bird 10/03/21 at  3:35 PM EDT by: MyChart video and verified that I am speaking with the correct person using two identifiers.  Patient is located at home and provider is located at [facility].     I discussed the limitations, risks, security and privacy concerns of performing an evaluation and management service by MyChart video and the availability of in person appointments. I also discussed with the patient that there may be a patient responsible charge related to this service. By engaging in this virtual visit, you consent to the provision of healthcare.  Additionally, you authorize for your insurance to be billed for the services provided during this visit.  The patient expressed understanding and agreed to proceed.  The following staff members participated in the virtual visit:  Clarnce Flock, MD/MPH Attending Family Medicine Physician, [place] [facility]      PRENATAL VISIT NOTE  Subjective:  Jennifer Bird is a 26 y.o. G2P0010 at [redacted]w[redacted]d  for virtual video visit for ongoing prenatal care.  She is currently monitored for the following issues for this low-risk pregnancy and has Supervision of low-risk first pregnancy; Rubella non-immune status, antepartum; and COVID-19 affecting pregnancy in second trimester on their problem list.  Patient reports  sinus infection, taking sudafed .  Contractions: Irritability. Vag. Bleeding: None.  Movement: Present. Denies leaking of fluid.   The following portions of the patient's history were reviewed and updated as appropriate: allergies, current medications, past family history, past medical history, past social history, past surgical history and problem list.   Objective:  There were no vitals filed for this visit. Self-Obtained  Fetal Status:     Movement: Present     Assessment and Plan:  Pregnancy: G2P0010 at [redacted]w[redacted]d 1. Encounter for supervision of  low-risk first pregnancy in third trimester Normal fetal movement Not seen in person since 31 weeks Discussed importance of in person visit for swabs next visit  2. Rubella non-immune status, antepartum Offer MMR postpartum  3. Allergies Has had symptoms for >2 weeks, seen in MAU for nasal congestion and possible sinus infection on 09/17/2021 Taking sudafed but still not improving At this point will trial allergy meds, if not effective will give antibiotics at next appt  Preterm labor symptoms and general obstetric precautions including but not limited to vaginal bleeding, contractions, leaking of fluid and fetal movement were reviewed in detail with the patient.  Return in 1 week (on 10/10/2021) for Dyad patient, ob visit.  Future Appointments  Date Time Provider [place]  10/10/2021  3:35 PM Clarnce Flock, MD [facility]  10/16/2021  1:15 PM Clarnce Flock, MD [facility]  10/30/2021  3:15 PM [facility]  10/30/2021  4:15 PM Donnamae Jude, MD [facility]     Time spent on virtual visit: 10 minutes  Clarnce Flock, MD

## 2021-10-03 NOTE — Progress Notes (Signed)
Called patient x 2. No answer. Unable to leave VM due to not set up . Jennifer Bird

## 2021-10-10 ENCOUNTER — Other Ambulatory Visit: Payer: Self-pay

## 2021-10-10 ENCOUNTER — Other Ambulatory Visit (HOSPITAL_COMMUNITY)
Admission: RE | Admit: 2021-10-10 | Discharge: 2021-10-10 | Disposition: A | Discharge status: Home / Self Care | Payer: Medicaid Other | Source: Ambulatory Visit | Attending: Family Medicine | Admitting: Family Medicine

## 2021-10-10 ENCOUNTER — Encounter: Payer: Self-pay | Admitting: Family Medicine

## 2021-10-10 ENCOUNTER — Ambulatory Visit (INDEPENDENT_AMBULATORY_CARE_PROVIDER_SITE_OTHER): Payer: Medicaid Other | Admitting: Family Medicine

## 2021-10-10 VITALS — BP 125/75 | HR 97 | Wt 226.7 lb

## 2021-10-10 DIAGNOSIS — Z3403 Encounter for supervision of normal first pregnancy, third trimester: Secondary | ICD-10-CM | POA: Insufficient documentation

## 2021-10-10 DIAGNOSIS — Z3A37 37 weeks gestation of pregnancy: Secondary | ICD-10-CM

## 2021-10-10 DIAGNOSIS — O09899 Supervision of other high risk pregnancies, unspecified trimester: Secondary | ICD-10-CM

## 2021-10-10 DIAGNOSIS — Z2839 Other underimmunization status: Secondary | ICD-10-CM

## 2021-10-10 DIAGNOSIS — T7840XD Allergy, unspecified, subsequent encounter: Secondary | ICD-10-CM

## 2021-10-10 NOTE — Patient Instructions (Signed)

## 2021-10-10 NOTE — Progress Notes (Signed)
   Subjective:  Jennifer Bird is a 26 y.o. G2P0010 at [redacted]w[redacted]d being seen today for ongoing prenatal care.  She is currently monitored for the following issues for this low-risk pregnancy and has Supervision of low-risk first pregnancy; Rubella non-immune status, antepartum; COVID-19 affecting pregnancy in second trimester; and Allergies on their problem list.  Patient reports no complaints.  Contractions: Irritability. Vag. Bleeding: None.  Movement: Present. Denies leaking of fluid.   The following portions of the patient's history were reviewed and updated as appropriate: allergies, current medications, past family history, past medical history, past social history, past surgical history and problem list. Problem list updated.  Objective:   Vitals:   10/10/21 1509  BP: 125/75  Pulse: 97  Weight: 226 lb 11.2 oz (102.8 kg)    Fetal Status: Fetal Heart Rate (bpm): 145   Movement: Present  Presentation: Vertex  General:  Alert, oriented and cooperative. Patient is in no acute distress.  Skin: Skin is warm and dry. No rash noted.   Cardiovascular: Normal heart rate noted  Respiratory: Normal respiratory effort, no problems with respiration noted  Abdomen: Soft, gravid, appropriate for gestational age. Pain/Pressure: Present     Pelvic: Vag. Bleeding: None     Cervical exam performed Dilation: Closed Effacement (%): Thick Station: -2  Extremities: Normal range of motion.     Mental Status: Normal mood and affect. Normal behavior. Normal judgment and thought content.   Urinalysis:      Assessment and Plan:  Pregnancy: G2P0010 at [redacted]w[redacted]d  1. Encounter for supervision of low-risk first pregnancy in third trimester BP and FHR normal Swabs collected Cervix closed and long, vertex - GC/Chlamydia probe amp ([place])not at [facility] - Culture, beta strep (group b only)  2. Rubella non-immune status, antepartum Offer MMR PP  3. Allergy, subsequent encounter Significantly better with  flonase Symptoms mostly resolved  Term labor symptoms and general obstetric precautions including but not limited to vaginal bleeding, contractions, leaking of fluid and fetal movement were reviewed in detail with the patient. Please refer to After Visit Summary for other counseling recommendations.  Return in 1 week (on 10/17/2021) for Dyad patient, ob visit.   Clarnce Flock, MD

## 2021-10-11 LAB — GC/CHLAMYDIA PROBE AMP (~~LOC~~) NOT AT ARMC
Chlamydia: NEGATIVE
Comment: NEGATIVE
Comment: NORMAL
Neisseria Gonorrhea: NEGATIVE

## 2021-10-14 LAB — CULTURE, BETA STREP (GROUP B ONLY): Strep Gp B Culture: NEGATIVE

## 2021-10-16 ENCOUNTER — Ambulatory Visit (INDEPENDENT_AMBULATORY_CARE_PROVIDER_SITE_OTHER): Payer: Medicaid Other | Admitting: Family Medicine

## 2021-10-16 ENCOUNTER — Other Ambulatory Visit: Payer: Self-pay

## 2021-10-16 VITALS — BP 104/60 | HR 92 | Wt 218.0 lb

## 2021-10-16 DIAGNOSIS — Z3403 Encounter for supervision of normal first pregnancy, third trimester: Secondary | ICD-10-CM

## 2021-10-16 DIAGNOSIS — Z2839 Other underimmunization status: Secondary | ICD-10-CM

## 2021-10-16 DIAGNOSIS — O09899 Supervision of other high risk pregnancies, unspecified trimester: Secondary | ICD-10-CM

## 2021-10-21 ENCOUNTER — Inpatient Hospital Stay (HOSPITAL_COMMUNITY)
Admission: AD | Admit: 2021-10-21 | Discharge: 2021-10-21 | Disposition: A | Discharge status: Home / Self Care | Payer: Medicaid Other | Attending: Obstetrics and Gynecology | Admitting: Obstetrics and Gynecology

## 2021-10-21 ENCOUNTER — Encounter (HOSPITAL_COMMUNITY): Payer: Self-pay | Admitting: Obstetrics and Gynecology

## 2021-10-21 DIAGNOSIS — O471 False labor at or after 37 completed weeks of gestation: Secondary | ICD-10-CM | POA: Insufficient documentation

## 2021-10-21 DIAGNOSIS — O479 False labor, unspecified: Secondary | ICD-10-CM

## 2021-10-21 DIAGNOSIS — Z3A39 39 weeks gestation of pregnancy: Secondary | ICD-10-CM | POA: Insufficient documentation

## 2021-10-21 LAB — URINALYSIS, ROUTINE W REFLEX MICROSCOPIC
Bilirubin Urine: NEGATIVE
Glucose, UA: NEGATIVE mg/dL
Hgb urine dipstick: NEGATIVE
Ketones, ur: NEGATIVE mg/dL
Leukocytes,Ua: NEGATIVE
Nitrite: NEGATIVE
Protein, ur: NEGATIVE mg/dL
Specific Gravity, Urine: 1.008 (ref 1.005–1.030)
pH: 6 (ref 5.0–8.0)

## 2021-10-21 MED ORDER — OXYCODONE-ACETAMINOPHEN 5-325 MG PO TABS
2.0000 | ORAL_TABLET | Freq: Once | ORAL | Status: AC
  Administered 2021-10-21: 2 via ORAL
  Filled 2021-10-21: qty 2

## 2021-10-21 NOTE — MAU Note (Signed)
.  Jennifer Bird is a 26 y.o. at [redacted]w[redacted]d here in MAU reporting: Brought by EMS. Pt states that she was awakened from her sleep and felt intense pressure around abdomen at 0250. Denies DFM, leaking of fluid or Vaginal discharge nor vaginal bleeding.   Onset of complaint:0250 Pain score: 8/10 Vitals:   10/21/21 0337  BP: 108/66  Pulse: 75  Resp: 19  Temp: 98.1 F (36.7 C)  SpO2: 99%    FHT:150 Lab orders placed from triage:  labor eval.

## 2021-10-21 NOTE — MAU Provider Note (Signed)
Jennifer Bird is a 26 y.o. G2P0010 female at [redacted]w[redacted]d.  RN labor check, not seen by provider.  SVE by RN: Dilation: Closed Effacement (%): Thick Exam by:: Santiago Bur, RN.  NST: FHR baseline 135 bpm, Variability: Moderate, Accelerations: Present, Decelerations: Absent = Cat 1/Reactive Toco: Every 2-5 minutes    Assessment and Plan: - Cervix remains closed on recheck by RN - Pain improved - False labor  - Cat 1, reactive NST  - Stable for discharge with return precautions   Worthy Rancher, MD  10/21/2021 5:27 AM

## 2021-10-23 ENCOUNTER — Encounter: Payer: Medicaid Other | Admitting: Family Medicine

## 2021-10-26 ENCOUNTER — Ambulatory Visit (INDEPENDENT_AMBULATORY_CARE_PROVIDER_SITE_OTHER): Payer: Medicaid Other | Admitting: Family Medicine

## 2021-10-26 VITALS — BP 115/77 | HR 80 | Wt 224.0 lb

## 2021-10-26 DIAGNOSIS — O09893 Supervision of other high risk pregnancies, third trimester: Secondary | ICD-10-CM

## 2021-10-26 DIAGNOSIS — Z3403 Encounter for supervision of normal first pregnancy, third trimester: Secondary | ICD-10-CM

## 2021-10-26 DIAGNOSIS — Z2839 Other underimmunization status: Secondary | ICD-10-CM

## 2021-10-26 DIAGNOSIS — O09899 Supervision of other high risk pregnancies, unspecified trimester: Secondary | ICD-10-CM

## 2021-10-26 DIAGNOSIS — Z3A40 40 weeks gestation of pregnancy: Secondary | ICD-10-CM

## 2021-10-27 NOTE — Progress Notes (Signed)
   PRENATAL VISIT NOTE  Subjective:  Jennifer Bird is a 26 y.o. G2P0010 at [redacted]w[redacted]d being seen today for ongoing prenatal care.  She is currently monitored for the following issues for this low-risk pregnancy and has Supervision of low-risk first pregnancy; Rubella non-immune status, antepartum; COVID-19 affecting pregnancy in second trimester; and Allergies on their problem list.  Patient reports no complaints.  Contractions: Irritability. Vag. Bleeding: None.  Movement: Present. Denies leaking of fluid.   The following portions of the patient's history were reviewed and updated as appropriate: allergies, current medications, past family history, past medical history, past social history, past surgical history and problem list.   Objective:   Vitals:   10/26/21 1615  BP: 115/77  Pulse: 80  Weight: 224 lb (101.6 kg)    Fetal Status: Fetal Heart Rate (bpm): 150   Movement: Present     General:  Alert, oriented and cooperative. Patient is in no acute distress.  Skin: Skin is warm and dry. No rash noted.   Cardiovascular: Normal heart rate noted  Respiratory: Normal respiratory effort, no problems with respiration noted  Abdomen: Soft, gravid, appropriate for gestational age.  Pain/Pressure: Present     Pelvic: Cervical exam deferred        Extremities: Normal range of motion.     Mental Status: Normal mood and affect. Normal behavior. Normal judgment and thought content.   Assessment and Plan:  Pregnancy: G2P0010 at [redacted]w[redacted]d 1. Encounter for supervision of low-risk first pregnancy in third trimester For IOL at 11 weeks--scheduled, orders placed Has visit with NST next week  2. Rubella non-immune status, antepartum Will need MMR pp  Term labor symptoms and general obstetric precautions including but not limited to vaginal bleeding, contractions, leaking of fluid and fetal movement were reviewed in detail with the patient. Please refer to After Visit Summary for other counseling  recommendations.   Return in 1 week (on 11/02/2021).  Future Appointments  Date Time Provider [place]  10/30/2021  3:15 PM [facility] NST [facility]  10/30/2021  4:15 PM Donnamae Jude, MD [place] [facility]  11/04/2021 12:00 AM [facility] SCHED ROOM [facility] None    Donnamae Jude, MD

## 2021-10-29 ENCOUNTER — Other Ambulatory Visit: Payer: Self-pay | Admitting: Advanced Practice Midwife

## 2021-10-30 ENCOUNTER — Other Ambulatory Visit: Payer: Medicaid Other

## 2021-10-30 ENCOUNTER — Encounter: Payer: Medicaid Other | Admitting: Family Medicine

## 2021-11-02 ENCOUNTER — Encounter (HOSPITAL_COMMUNITY): Payer: Self-pay | Admitting: Obstetrics and Gynecology

## 2021-11-02 ENCOUNTER — Ambulatory Visit (INDEPENDENT_AMBULATORY_CARE_PROVIDER_SITE_OTHER): Payer: Medicaid Other | Admitting: Family Medicine

## 2021-11-02 ENCOUNTER — Ambulatory Visit (INDEPENDENT_AMBULATORY_CARE_PROVIDER_SITE_OTHER): Payer: Medicaid Other | Admitting: General Practice

## 2021-11-02 ENCOUNTER — Other Ambulatory Visit: Payer: Self-pay | Admitting: Family Medicine

## 2021-11-02 ENCOUNTER — Inpatient Hospital Stay (HOSPITAL_COMMUNITY)
Admission: AD | Admit: 2021-11-02 | Payer: Medicaid Other | Source: Home / Self Care | Admitting: Obstetrics and Gynecology

## 2021-11-02 ENCOUNTER — Ambulatory Visit (INDEPENDENT_AMBULATORY_CARE_PROVIDER_SITE_OTHER): Payer: Medicaid Other

## 2021-11-02 ENCOUNTER — Inpatient Hospital Stay (HOSPITAL_COMMUNITY)
Admission: AD | Admit: 2021-11-02 | Discharge: 2021-11-05 | DRG: 806 | Disposition: A | Discharge status: Home / Self Care | Payer: Medicaid Other | Attending: Obstetrics & Gynecology | Admitting: Obstetrics & Gynecology

## 2021-11-02 ENCOUNTER — Other Ambulatory Visit: Payer: Self-pay

## 2021-11-02 VITALS — BP 111/73 | HR 82

## 2021-11-02 DIAGNOSIS — O09899 Supervision of other high risk pregnancies, unspecified trimester: Secondary | ICD-10-CM

## 2021-11-02 DIAGNOSIS — Z3A41 41 weeks gestation of pregnancy: Secondary | ICD-10-CM | POA: Diagnosis not present

## 2021-11-02 DIAGNOSIS — Z3A40 40 weeks gestation of pregnancy: Secondary | ICD-10-CM

## 2021-11-02 DIAGNOSIS — Z8616 Personal history of COVID-19: Secondary | ICD-10-CM | POA: Diagnosis not present

## 2021-11-02 DIAGNOSIS — O480 Post-term pregnancy: Secondary | ICD-10-CM | POA: Diagnosis present

## 2021-11-02 DIAGNOSIS — Z23 Encounter for immunization: Secondary | ICD-10-CM | POA: Diagnosis not present

## 2021-11-02 DIAGNOSIS — O9081 Anemia of the puerperium: Secondary | ICD-10-CM | POA: Diagnosis not present

## 2021-11-02 DIAGNOSIS — Z3400 Encounter for supervision of normal first pregnancy, unspecified trimester: Secondary | ICD-10-CM

## 2021-11-02 DIAGNOSIS — Z2839 Other underimmunization status: Secondary | ICD-10-CM

## 2021-11-02 DIAGNOSIS — Z3403 Encounter for supervision of normal first pregnancy, third trimester: Secondary | ICD-10-CM

## 2021-11-02 DIAGNOSIS — D62 Acute posthemorrhagic anemia: Secondary | ICD-10-CM | POA: Diagnosis not present

## 2021-11-02 LAB — TYPE AND SCREEN
ABO/RH(D): B POS
Antibody Screen: NEGATIVE

## 2021-11-02 LAB — CBC
HCT: 32.3 % — ABNORMAL LOW (ref 36.0–46.0)
Hemoglobin: 11.1 g/dL — ABNORMAL LOW (ref 12.0–15.0)
MCH: 28.7 pg (ref 26.0–34.0)
MCHC: 34.4 g/dL (ref 30.0–36.0)
MCV: 83.5 fL (ref 80.0–100.0)
Platelets: 170 10*3/uL (ref 150–400)
RBC: 3.87 MIL/uL (ref 3.87–5.11)
RDW: 13.5 % (ref 11.5–15.5)
WBC: 7 10*3/uL (ref 4.0–10.5)
nRBC: 0 % (ref 0.0–0.2)

## 2021-11-02 MED ORDER — OXYTOCIN-SODIUM CHLORIDE 30-0.9 UT/500ML-% IV SOLN: 2.5000 [IU]/h | INTRAVENOUS | Status: DC

## 2021-11-02 MED ORDER — TERBUTALINE SULFATE 1 MG/ML IJ SOLN
0.2500 mg | Freq: Once | INTRAMUSCULAR | Status: DC | PRN
Start: 2021-11-02 — End: 2021-11-04

## 2021-11-02 MED ORDER — ONDANSETRON HCL 4 MG/2ML IJ SOLN
4.0000 mg | Freq: Four times a day (QID) | INTRAMUSCULAR | Status: DC | PRN
  Administered 2021-11-02: 4 mg via INTRAVENOUS
  Filled 2021-11-02: qty 2

## 2021-11-02 MED ORDER — LACTATED RINGERS IV SOLN
500.0000 mL | INTRAVENOUS | Status: DC | PRN
  Administered 2021-11-02: 500 mL via INTRAVENOUS

## 2021-11-02 MED ORDER — OXYTOCIN BOLUS FROM INFUSION
333.0000 mL | Freq: Once | INTRAVENOUS | Status: AC
  Administered 2021-11-03: 333 mL via INTRAVENOUS

## 2021-11-02 MED ORDER — LACTATED RINGERS IV SOLN: INTRAVENOUS | Status: DC

## 2021-11-02 MED ORDER — FLEET ENEMA 7-19 GM/118ML RE ENEM: 1.0000 | ENEMA | RECTAL | Status: DC | PRN

## 2021-11-02 MED ORDER — OXYCODONE-ACETAMINOPHEN 5-325 MG PO TABS: 1.0000 | ORAL_TABLET | ORAL | Status: DC | PRN

## 2021-11-02 MED ORDER — OXYCODONE-ACETAMINOPHEN 5-325 MG PO TABS: 2.0000 | ORAL_TABLET | ORAL | Status: DC | PRN

## 2021-11-02 MED ORDER — MISOPROSTOL 50MCG HALF TABLET
50.0000 ug | ORAL_TABLET | ORAL | Status: DC | PRN
  Administered 2021-11-02 – 2021-11-03 (×2): 50 ug via BUCCAL
  Filled 2021-11-02 (×2): qty 1

## 2021-11-02 MED ORDER — SOD CITRATE-CITRIC ACID 500-334 MG/5ML PO SOLN: 30.0000 mL | ORAL | Status: DC | PRN

## 2021-11-02 MED ORDER — MISOPROSTOL 25 MCG QUARTER TABLET: 25.0000 ug | ORAL_TABLET | ORAL | Status: DC | PRN

## 2021-11-02 MED ORDER — LIDOCAINE HCL (PF) 1 % IJ SOLN: 30.0000 mL | INTRAMUSCULAR | Status: DC | PRN

## 2021-11-02 MED ORDER — FENTANYL CITRATE (PF) 100 MCG/2ML IJ SOLN
100.0000 ug | INTRAMUSCULAR | Status: DC | PRN
  Administered 2021-11-02 – 2021-11-03 (×4): 100 ug via INTRAVENOUS
  Filled 2021-11-02 (×4): qty 2

## 2021-11-02 MED ORDER — ZOLPIDEM TARTRATE 5 MG PO TABS
5.0000 mg | ORAL_TABLET | Freq: Every evening | ORAL | Status: DC | PRN
  Administered 2021-11-02: 5 mg via ORAL
  Filled 2021-11-02: qty 1

## 2021-11-02 MED ORDER — ACETAMINOPHEN 325 MG PO TABS: 650.0000 mg | ORAL_TABLET | ORAL | Status: DC | PRN

## 2021-11-02 NOTE — MAU Note (Signed)
Sent from office for induction for BPP 6/10. Reports good fetal  movement occasional  mild ctx. Denies any vag bleeding or leaking.  Holding pt in MAU until bed become available in L&D.

## 2021-11-02 NOTE — Progress Notes (Signed)
Labor Progress Note Jennifer Bird is a 26 y.o. G2P0010 at [redacted]w[redacted]d who presented for IOL due to BPP 6/10.   S: Doing well. Foley balloon now out. No concerns.   O:  BP 128/76   Pulse 93   Temp 98 F (36.7 C) (Oral)   Resp 16   Ht 5\' 9"  (1.753 m)   Wt 100.7 kg   LMP 01/20/2021 (Within Days)   BMI 32.78 kg/m   EFM: Baseline 135 bpm, moderate variability, + accels, no decels  Toco: Every 2 minutes   CVE: Dilation: 3.5 Effacement (%): Thick Cervical Position: Posterior Station: -3 Presentation: Vertex Exam by:: [email or web address]  A&P: 26 y.o. G2P0010 [redacted]w[redacted]d   #Labor: Progressing well. Foley balloon now out. Will give dose of buccal Cytotec and reassess in 4 hours.  #Pain: PRN; IV Fentanyl for now  #FWB: Cat 1  #GBS negative  [redacted]w[redacted]d, MD 8:26 PM

## 2021-11-02 NOTE — Progress Notes (Signed)
   PRENATAL VISIT NOTE  Subjective:  Jennifer Bird is a 26 y.o. G2P0010 at [redacted]w[redacted]d being seen today for ongoing prenatal care.  She is currently monitored for the following issues for this low-risk pregnancy and has Supervision of low-risk first pregnancy; Rubella non-immune status, antepartum; COVID-19 affecting pregnancy in second trimester; and Allergies on their problem list.  Patient reports no complaints.  Contractions: Irritability. Vag. Bleeding: None.  Movement: Present. Denies leaking of fluid.   The following portions of the patient's history were reviewed and updated as appropriate: allergies, current medications, past family history, past medical history, past social history, past surgical history and problem list.   Objective:   Vitals:   11/02/21 1104  BP: 111/73  Pulse: 82    Fetal Status: Fetal Heart Rate (bpm): NST   Movement: Present     General:  Alert, oriented and cooperative. Patient is in no acute distress.  Skin: Skin is warm and dry. No rash noted.   Cardiovascular: Normal heart rate noted  Respiratory: Normal respiratory effort, no problems with respiration noted  Abdomen: Soft, gravid, appropriate for gestational age.  Pain/Pressure: Present     Pelvic: Cervical exam deferred        Extremities: Normal range of motion.  Edema: None  Mental Status: Normal mood and affect. Normal behavior. Normal judgment and thought content.   Assessment and Plan:  Pregnancy: G2P0010 at [redacted]w[redacted]d 1. Encounter for supervision of low-risk first pregnancy in third trimester Continue routine prenatal care.  2. Rubella non-immune status, antepartum Needs MMR pp  3. Post term pregnancy at [redacted] weeks gestation NST:  Baseline: 150 bpm, Variability: Good {> 6 bpm), Accelerations: non-reactive, and Decelerations: Absent BPP 6/8, -2 for breathing Will move to delivery for NR NST--labor notified.  Term labor symptoms and general obstetric precautions including but not limited to  vaginal bleeding, contractions, leaking of fluid and fetal movement were reviewed in detail with the patient. Please refer to After Visit Summary for other counseling recommendations.   Return in 4 weeks (on 11/30/2021) for Mom+Baby Care, pp check.  Future Appointments  Date Time Provider [place]  11/02/2021 11:35 AM [facility] US1 [facility]  11/04/2021 12:00 AM [facility] SCHED ROOM [facility] None    Donnamae Jude, MD

## 2021-11-02 NOTE — Progress Notes (Signed)
Pt informed that the ultrasound is considered a limited OB ultrasound and is not intended to be a complete ultrasound exam.  Patient also informed that the ultrasound is not being completed with the intent of assessing for fetal or placental anomalies or any pelvic abnormalities.  Explained that the purpose of today's ultrasound is to assess for  BPP, presentation, and AFI.  Patient acknowledges the purpose of the exam and the limitations of the study.     Patient's NST/BPP was 6/10. Discussed with Dr Shawnie Pons who recommends IOL. Discussed with patient & directions provided. Hospital staff was notified.  Chase Caller RN BSN 11/02/21

## 2021-11-02 NOTE — H&P (Signed)
OBSTETRIC ADMISSION HISTORY AND PHYSICAL  Jennifer Bird is a 26 y.o. female G2P0010 with IUP at [redacted]w[redacted]d by LMP presenting for IOL due to BPP 6/10. She reports +FMs, no LOF, no VB, no blurry vision, headaches, peripheral edema, or RUQ pain.  She plans on bottle feeding. She plans to discuss contraception at her postpartum visit.   She received her prenatal care at [facility]   Dating: By LMP --->  Estimated Date of Delivery: 10/27/21  Sono:   '@[redacted]w[redacted]d'$ , CWD, normal anatomy, breech presentation, anterior placental lie, 586 g, 60% EFW  Prenatal History/Complications:  Rubella non-immune  COVID-19 affecting second trimester pregnancy   Past Medical History: Past Medical History:  Diagnosis Date   Asthma    Bronchitis    COVID-19 05/07/2021   Headache    UTI (urinary tract infection)     Past Surgical History: Past Surgical History:  Procedure Laterality Date   NO PAST SURGERIES      Obstetrical History: OB History     Gravida  2   Para  0   Term  0   Preterm  0   AB  1   Living  0      SAB  1   IAB  0   Ectopic  0   Multiple  0   Live Births  0           Social History Social History   Socioeconomic History   Marital status: Single    Spouse name: Not on file   Number of children: Not on file   Years of education: Not on file   Highest education level: Not on file  Occupational History   Not on file  Tobacco Use   Smoking status: Never   Smokeless tobacco: Never  Vaping Use   Vaping Use: Never used  Substance and Sexual Activity   Alcohol use: Not Currently    Alcohol/week: 1.0 standard drink of alcohol    Types: 1 Glasses of wine per week    Comment: not whwile preg   Drug use: No   Sexual activity: Not Currently    Birth control/protection: None  Other Topics Concern   Not on file  Social History Narrative   Not on file   Social Determinants of Health   Financial Resource Strain: Not on file  Food  Insecurity: Food Insecurity Present (11/02/2021)   Hunger Vital Sign    Worried About Running Out of Food in the Last Year: Sometimes true    Ran Out of Food in the Last Year: Sometimes true  Transportation Needs: No Transportation Needs (11/02/2021)   PRAPARE - Hydrologist (Medical): No    Lack of Transportation (Non-Medical): No  Physical Activity: Not on file  Stress: Not on file  Social Connections: Not on file    Family History: Family History  Problem Relation Age of Onset   Hypertension Mother    Cirrhosis Father     Allergies: Allergies  Allergen Reactions   Codeine Shortness Of Breath and Swelling    Pt has taken other pain medications, including oxycodone without reaction.   Tomato Anaphylaxis    Medications Prior to Admission  Medication Sig Dispense Refill Last Dose   acetaminophen (TYLENOL) 325 MG tablet Take 650 mg by mouth every 6 (six) hours as needed.   Past Month   cyclobenzaprine (FLEXERIL) 10 MG tablet Take 10 mg by mouth 3 (three) times daily  as needed for muscle spasms.   Past Month   Prenatal Vit-Fe Fumarate-FA (PRENATAL VITAMIN AND MINERAL) 28-0.8 MG TABS Take 1 tablet by mouth daily. 30 tablet 11 11/02/2021   calcium carbonate (TUMS - DOSED IN MG ELEMENTAL CALCIUM) 500 MG chewable tablet Chew 1 tablet by mouth daily.        Review of Systems  All systems reviewed and negative except as stated in HPI  Blood pressure 129/80, pulse (!) 115, temperature 98.1 F (36.7 C), temperature source Oral, resp. rate 16, height $RemoveBe'5\' 9"'DoAJIjPFq$  (1.753 m), weight 100.7 kg, last menstrual period 01/20/2021, unknown if currently breastfeeding.  General appearance: alert, cooperative, and no distress Lungs: normal work of breathing on room air  Heart: normal rate, warm and well perfused  Abdomen: soft, non-tender, gravid  Extremities: no LE edema or calf tenderness to palpation   Presentation: Cephalic  Fetal monitoring: Baseline 135 bpm,  moderate variability, + accels, no decels  Uterine activity: Irregular contractions  Dilation: 1.5 Effacement (%): Thick Station: -3 Exam by:: Gwenlyn Perking, MD  Prenatal labs: ABO, Rh: --/--/B POS (07/13 1351) Antibody: NEG (07/13 1351) Rubella: <0.90 (12/20 0954) RPR: Non Reactive (04/10 1012)  HBsAg: Negative (12/20 0954)  HIV: Non Reactive (04/10 1012)  GBS: Negative/-- (06/20 1541)  2 hr Glucola normal  Genetic screening - LR NIPS, Horizon negative  Anatomy US normal   Prenatal Transfer Tool  Maternal Diabetes: No Genetic Screening: Normal Maternal Ultrasounds/Referrals: Normal Fetal Ultrasounds or other Referrals:  None Maternal Substance Abuse:  No Significant Maternal Medications:  None Significant Maternal Lab Results: Group B Strep negative  Results for orders placed or performed during the hospital encounter of 11/02/21 (from the past 24 hour(s))  CBC   Collection Time: 11/02/21  1:14 PM  Result Value Ref Range   WBC 7.0 4.0 - 10.5 K/uL   RBC 3.87 3.87 - 5.11 MIL/uL   Hemoglobin 11.1 (L) 12.0 - 15.0 g/dL   HCT 32.3 (L) 36.0 - 46.0 %   MCV 83.5 80.0 - 100.0 fL   MCH 28.7 26.0 - 34.0 pg   MCHC 34.4 30.0 - 36.0 g/dL   RDW 13.5 11.5 - 15.5 %   Platelets 170 150 - 400 K/uL   nRBC 0.0 0.0 - 0.2 %  Type and screen   Collection Time: 11/02/21  1:51 PM  Result Value Ref Range   ABO/RH(D) B POS    Antibody Screen NEG    Sample Expiration      11/05/2021,2359 Performed at [facility], [place] [address] [place], [place] [address]     Patient Active Problem List   Diagnosis Date Noted   Post term pregnancy over 40 weeks 11/02/2021   Allergies 10/03/2021   COVID-19 affecting pregnancy in second trimester 05/07/2021   Rubella non-immune status, antepartum 04/12/2021   Supervision of low-risk first pregnancy 03/28/2021    Assessment/Plan:  Jennifer Bird is a 26 y.o. G2P0010 at [redacted]w[redacted]d here for IOL due to BPP 6/10.   #Labor: Reviewed induction methods with  patient. Foley balloon placement recommended and patient verbally consented. Foley balloon placed and filled with 30 cc. Mom and baby tolerated this well. Contracting frequently after placement. Will monitor contraction pattern and will consider Cytotec if contractions space. Will reassess in 4 hours.  #Pain: PRN; coping well  #FWB: Cat 1, reactive on admission. Will continue to monitor closely.  #ID:  GBS negative  #MOF: Bottle  #MOC: Plans to decided at postpartum visit  #Circ:  Desires inpatient   #Rubella non-immune: MMR postpartum.   Genia Del, MD  11/02/2021, 5:51 PM

## 2021-11-03 ENCOUNTER — Inpatient Hospital Stay (HOSPITAL_COMMUNITY): Payer: Medicaid Other | Admitting: Anesthesiology

## 2021-11-03 DIAGNOSIS — Z3A41 41 weeks gestation of pregnancy: Secondary | ICD-10-CM

## 2021-11-03 DIAGNOSIS — O480 Post-term pregnancy: Secondary | ICD-10-CM

## 2021-11-03 LAB — RPR: RPR Ser Ql: NONREACTIVE

## 2021-11-03 MED ORDER — FENTANYL-BUPIVACAINE-NACL 0.5-0.125-0.9 MG/250ML-% EP SOLN: 12.0000 mL/h | EPIDURAL | Status: DC | PRN

## 2021-11-03 MED ORDER — PHENYLEPHRINE 80 MCG/ML (10ML) SYRINGE FOR IV PUSH (FOR BLOOD PRESSURE SUPPORT): 80.0000 ug | PREFILLED_SYRINGE | INTRAVENOUS | Status: DC | PRN

## 2021-11-03 MED ORDER — LIDOCAINE HCL (PF) 1 % IJ SOLN
INTRAMUSCULAR | Status: DC | PRN
  Administered 2021-11-03: 10 mL via EPIDURAL

## 2021-11-03 MED ORDER — TERBUTALINE SULFATE 1 MG/ML IJ SOLN: 0.2500 mg | Freq: Once | INTRAMUSCULAR | Status: DC | PRN

## 2021-11-03 MED ORDER — OXYTOCIN-SODIUM CHLORIDE 30-0.9 UT/500ML-% IV SOLN
1.0000 m[IU]/min | INTRAVENOUS | Status: DC
  Administered 2021-11-03: 2 m[IU]/min via INTRAVENOUS
  Filled 2021-11-03: qty 500

## 2021-11-03 MED ORDER — ACETAMINOPHEN 500 MG PO TABS
1000.0000 mg | ORAL_TABLET | Freq: Four times a day (QID) | ORAL | Status: DC | PRN
  Administered 2021-11-03 (×2): 1000 mg via ORAL
  Filled 2021-11-03 (×2): qty 2

## 2021-11-03 MED ORDER — EPHEDRINE 5 MG/ML INJ: 10.0000 mg | INTRAVENOUS | Status: DC | PRN

## 2021-11-03 MED ORDER — DIPHENHYDRAMINE HCL 50 MG/ML IJ SOLN: 12.5000 mg | INTRAMUSCULAR | Status: DC | PRN

## 2021-11-03 MED ORDER — FENTANYL-BUPIVACAINE-NACL 0.5-0.125-0.9 MG/250ML-% EP SOLN
12.0000 mL/h | EPIDURAL | Status: DC | PRN
  Administered 2021-11-03: 12 mL/h via EPIDURAL
  Filled 2021-11-03: qty 250

## 2021-11-03 MED ORDER — LACTATED RINGERS IV SOLN
500.0000 mL | Freq: Once | INTRAVENOUS | Status: AC
  Administered 2021-11-03: 500 mL via INTRAVENOUS

## 2021-11-03 NOTE — Discharge Summary (Signed)
Postpartum Discharge Summary  Date of Service updated***     Patient Name: Jennifer Bird DOB: 26-Mar-1996 MRN: [id number]  Date of admission: 11/02/2021 Delivery date:11/03/2021  Delivering provider: Genia Del  Date of discharge: 11/03/2021  Admitting diagnosis: Post term pregnancy over 40 weeks [O48.0] Intrauterine pregnancy: [redacted]w[redacted]d     Secondary diagnosis:  Principal Problem:   Vaginal delivery Active Problems:   Supervision of low-risk first pregnancy   Rubella non-immune status, antepartum   Post term pregnancy over 40 weeks  Additional problems: ***    Discharge diagnosis: Term Pregnancy Delivered                                              Post partum procedures: {Postpartum procedures:23558} Augmentation: AROM, Pitocin, Cytotec, and IP Foley Complications: None  Hospital course: Induction of Labor With Vaginal Delivery   [name] at [redacted]w[redacted]d was admitted to the hospital 11/02/2021 for induction of labor.  Indication for induction:  BPP 6/8 and NRNST postdates .  Patient had an uncomplicated labor course and progressed to complete after augmentation above. She had a normal vaginal delivery.  Membrane Rupture Time/Date: 9:08 AM ,11/03/2021   Delivery Method:Vaginal, Spontaneous  Episiotomy: None  Lacerations:  2nd degree;Perineal  Details of delivery can be found in separate delivery note.  Patient had a routine postpartum course.  She is eating, drinking, voiding, and ambulating without issue.  Her pain and bleeding are controlled.  She is ***feeding well.  Patient is discharged home 11/03/21.  Newborn Data: Birth date:11/03/2021  Birth time:11:05 PM  Gender:Female  Living status:Living  Apgars:9 ,9  Weight:   Magnesium Sulfate received: No BMZ received: No Rhophylac: N/A MMR: Needs postpartum *** T-DaP: Given prenatally Flu: No Transfusion: No  Physical exam  Vitals:   11/03/21 2231 11/03/21 2316 11/03/21 2319 11/03/21 2330  BP: (!) 120/56  134/62 134/62 122/74  Pulse: (!) 110 (!) 108 (!) 108 (!) 113  Resp: $Remo'18 18 18 18  'vTYVr$ Temp:  (!) 103 F (39.4 C)    TempSrc:  Axillary    SpO2:      Weight:      Height:       General: {Exam; general:21111117} Lochia: {Desc; appropriate/inappropriate:30686::"appropriate"} Uterine Fundus: {Desc; firm/soft:30687} Incision: {Exam; incision:21111123} DVT Evaluation: {Exam; EGB:1517616}  Labs: Lab Results  Component Value Date   WBC 7.0 11/02/2021   HGB 11.1 (L) 11/02/2021   HCT 32.3 (L) 11/02/2021   MCV 83.5 11/02/2021   PLT 170 11/02/2021      Latest Ref Rng & Units 08/23/2021    7:33 PM  CMP  Glucose 70 - 99 mg/dL 77   BUN 6 - 20 mg/dL <5   Creatinine 0.44 - 1.00 mg/dL 0.62   Sodium 135 - 145 mmol/L 136   Potassium 3.5 - 5.1 mmol/L 3.6   Chloride 98 - 111 mmol/L 105   CO2 22 - 32 mmol/L 23   Calcium 8.9 - 10.3 mg/dL 9.0   Total Protein 6.5 - 8.1 g/dL 5.9   Total Bilirubin 0.3 - 1.2 mg/dL 0.5   Alkaline Phos 38 - 126 U/L 81   AST 15 - 41 U/L 18   ALT 0 - 44 U/L 17    Edinburgh Score:     No data to display          After  visit meds:  Allergies as of 11/03/2021       Reactions   Codeine Shortness Of Breath, Swelling   Pt has taken other pain medications, including oxycodone without reaction.   Tomato Anaphylaxis     Med Rec must be completed prior to using this [facility]***       Discharge home in stable condition Infant Feeding: {Baby feeding:23562} Infant Disposition:{CHL IP OB HOME WITH QJFHLK:56256} Discharge instruction: per After Visit Summary and Postpartum booklet. Activity: Advance as tolerated. Pelvic rest for 6 weeks.  Diet: routine diet Future Appointments:No future appointments. Follow up Visit: Message sent to [place] by Dr. Gwenlyn Perking on 11/03/21.   Please schedule this patient for a In person postpartum visit in 6 weeks with the following provider: [facility]. Additional Postpartum F/U:  None   Low risk pregnancy  complicated by:  N/A Delivery mode:  Vaginal, Spontaneous  Anticipated Birth Control:  Plans to decide at postpartum visit.  11/03/2021 Genia Del, MD

## 2021-11-03 NOTE — Progress Notes (Signed)
Jennifer Bird is a 26 y.o. G2P0010 at [redacted]w[redacted]d by LMP admitted for induction of labor due to Post dates and BPP 6/10.  Subjective: Resting comfortably after epidural. No complaints.   Objective: BP 119/74   Pulse 91   Temp 98 F (36.7 C) (Oral)   Resp 18   Ht 5\' 9"  (1.753 m)   Wt 100.7 kg   LMP 01/20/2021 (Within Days)   SpO2 100%   BMI 32.78 kg/m  No intake/output data recorded. Total I/O In: -  Out: 1100 [Urine:1100]  FHT:  FHR: 150 bpm, variability: moderate,  accelerations:  Present,  decelerations:  Absent UC:   irregular, every 3-4 minutes SVE:   Dilation: 5 Effacement (%): 90 Station: -1 Exam by:: [email or web address], RN  Labs: Lab Results  Component Value Date   WBC 7.0 11/02/2021   HGB 11.1 (L) 11/02/2021   HCT 32.3 (L) 11/02/2021   MCV 83.5 11/02/2021   PLT 170 11/02/2021    Assessment / Plan:  Labor:  Progressing on pitocin. Continue current management. Recheck 2200.  Fetal Wellbeing:  Category I Pain Control:  Epidural  11/04/2021, DO 11/03/2021, 6:32 PM

## 2021-11-03 NOTE — Progress Notes (Addendum)
Labor Progress Note Jennifer Bird is a 26 y.o. G2P0010 at [redacted]w[redacted]d presented for IOL for post-dates with BPP 6/10.  S: Comfortable with epidural. No acute concerns at this time.   O:  BP 109/65   Pulse 96   Temp 98.7 F (37.1 C) (Oral)   Resp 18   Ht 5\' 9"  (1.753 m)   Wt 100.7 kg   LMP 01/20/2021 (Within Days)   SpO2 100%   BMI 32.78 kg/m   EFM: 145 bpm/moderate variability/+accels, no decels  CVE: Dilation: Lip/rim Effacement (%): 100 Cervical Position: Posterior Station: 0 Presentation: Vertex Exam by:: Dr [email or web address]   A&P: 26 y.o. G2P0010 [redacted]w[redacted]d IOL for post-dates with BPP 6/10. #Labor: Progressing well on pitocin. Continues to have meconium-stained fluid. IUPC in place, MVUs 140-200. Labor down for 1-2 hours and will recheck for lip/rim. Anticipate vaginal delivery. #Pain: Epidural #FWB: Cat 1 #GBS negative  [redacted]w[redacted]d, MD 10:27 PM  GME ATTESTATION:  I saw and evaluated the patient. I agree with the findings and the plan of care as documented in the resident's note. I have made changes to documentation as necessary.  Progressing well. Cat 1 tracing. MVUs adequate. Will continue Pitocin and reassess in 2 hours, sooner as needed.   Raylene Everts, MD OB Fellow, Faculty [facility], Center for [facility] 11/03/2021 10:36 PM

## 2021-11-03 NOTE — Progress Notes (Signed)
Pt resting.  FHR Cat 1.  Cx 4/40/-3.  Will try to thin cx some more w/one more cytotec, plan pitocin at 0400.

## 2021-11-03 NOTE — Anesthesia Preprocedure Evaluation (Signed)
Anesthesia Evaluation  Patient identified by MRN, date of birth, ID band Patient awake    Reviewed: Allergy & Precautions, H&P , NPO status , Patient's Chart, lab work & pertinent test results, reviewed documented beta blocker date and time   Airway Mallampati: II  TM Distance: >3 FB Neck ROM: full    Dental no notable dental hx.    Pulmonary neg pulmonary ROS,    Pulmonary exam normal breath sounds clear to auscultation       Cardiovascular negative cardio ROS Normal cardiovascular exam Rhythm:regular Rate:Normal     Neuro/Psych negative neurological ROS  negative psych ROS   GI/Hepatic negative GI ROS, Neg liver ROS,   Endo/Other  negative endocrine ROS  Renal/GU negative Renal ROS  negative genitourinary   Musculoskeletal   Abdominal   Peds  Hematology negative hematology ROS (+)   Anesthesia Other Findings   Reproductive/Obstetrics (+) Pregnancy                             Anesthesia Physical Anesthesia Plan  ASA: 2  Anesthesia Plan: Epidural   Post-op Pain Management: Minimal or no pain anticipated   Induction:   PONV Risk Score and Plan: Treatment may vary due to age or medical condition  Airway Management Planned: Natural Airway  Additional Equipment: None  Intra-op Plan:   Post-operative Plan:   Informed Consent: I have reviewed the patients History and Physical, chart, labs and discussed the procedure including the risks, benefits and alternatives for the proposed anesthesia with the patient or authorized representative who has indicated his/her understanding and acceptance.       Plan Discussed with: Anesthesiologist  Anesthesia Plan Comments:         Anesthesia Quick Evaluation

## 2021-11-03 NOTE — Anesthesia Procedure Notes (Signed)
Epidural Patient location during procedure: OB Start time: 11/03/2021 9:46 AM End time: 11/03/2021 9:50 AM  Staffing Anesthesiologist: Bethena Midget, MD  Preanesthetic Checklist Completed: patient identified, IV checked, site marked, risks and benefits discussed, surgical consent, monitors and equipment checked, pre-op evaluation and timeout performed  Epidural Patient position: sitting Prep: DuraPrep and site prepped and draped Patient monitoring: continuous pulse ox and blood pressure Approach: midline Location: L3-L4 Injection technique: LOR air  Needle:  Needle type: Tuohy  Needle gauge: 17 G Needle length: 9 cm and 9 Needle insertion depth: 7 cm Catheter type: closed end flexible Catheter size: 19 Gauge Catheter at skin depth: 12 cm Test dose: negative  Assessment Events: blood not aspirated, injection not painful, no injection resistance, no paresthesia and negative IV test

## 2021-11-03 NOTE — Progress Notes (Addendum)
Jennifer Bird is a 25 y.o. G2P0010 at [redacted]w[redacted]d by LMP admitted for induction of labor due to Post dates and BPP 6/10.  Subjective: Resting comfortably after epidural.   Objective: BP 112/60   Pulse 90   Temp 97.8 F (36.6 C) (Oral)   Resp 18   Ht 5\' 9"  (1.753 m)   Wt 100.7 kg   LMP 01/20/2021 (Within Days)   SpO2 100%   BMI 32.78 kg/m  No intake/output data recorded. No intake/output data recorded.  FHT:  FHR: 140 bpm, variability: moderate,  accelerations:  Present,  decelerations:  Absent UC:   regular, every 2-3 minutes SVE:   Dilation: 4 Effacement (%): 50 Station: -2 Exam by:: [email or web address], MD  Labs: Lab Results  Component Value Date   WBC 7.0 11/02/2021   HGB 11.1 (L) 11/02/2021   HCT 32.3 (L) 11/02/2021   MCV 83.5 11/02/2021   PLT 170 11/02/2021    Assessment / Plan:  Labor:  No cervical changes on reexamination, forebag AROMed and IUPC placed, will titrate pit until adequate contractions. Fetal Wellbeing:  Category I Pain Control:  Epidural   11/04/2021, DO 11/03/2021, 1:28 PM   GME ATTESTATION:  I saw and evaluated the patient. I agree with the findings and the plan of care as documented in the resident's note and have made all necessary edits.  11/05/2021, MD, MPH OB Fellow, Faculty Practice [facility], Center for [facility] 11/03/2021 3:23 PM

## 2021-11-03 NOTE — Progress Notes (Signed)
Labor Progress Note Jennifer Bird is a 26 y.o. G2P0010 at [redacted]w[redacted]d presented for IOL for post dates and BPP 6/10 S:  Patient reports she feels ready to move her labor along. Would like to be checked.  O:  BP 119/88   Pulse 86   Temp 97.6 F (36.4 C) (Oral)   Resp 18   Ht 5\' 9"  (1.753 m)   Wt 100.7 kg   LMP 01/20/2021 (Within Days)   BMI 32.78 kg/m  EFM: 150/moderate/+accels/intermittent decels after AROM, improved with position change  CVE: Dilation: 4 Effacement (%): 50 Cervical Position: Posterior Station: -2 Presentation: Vertex Exam by:: [email or web address], MD   A&P: 26 y.o. G2P0010 [redacted]w[redacted]d presented for IOL for post dates and BPP 6/10 #Labor: Cervix continued to be 4 cm. AROM performed with patient's consent during current check. Light meconium stained fluid expelled. Tolerated by fetus and patient #Pain: Plans epidural #FWB: Cat I overall but with some decels but improved with position change #GBS negative   8/10, MD, MPH OB Fellow, Faculty Practice

## 2021-11-04 ENCOUNTER — Inpatient Hospital Stay (HOSPITAL_COMMUNITY): Payer: Medicaid Other

## 2021-11-04 ENCOUNTER — Encounter (HOSPITAL_COMMUNITY): Payer: Self-pay | Admitting: Family Medicine

## 2021-11-04 LAB — CBC
HCT: 27.2 % — ABNORMAL LOW (ref 36.0–46.0)
Hemoglobin: 9.2 g/dL — ABNORMAL LOW (ref 12.0–15.0)
MCH: 28.8 pg (ref 26.0–34.0)
MCHC: 33.8 g/dL (ref 30.0–36.0)
MCV: 85.3 fL (ref 80.0–100.0)
Platelets: 168 10*3/uL (ref 150–400)
RBC: 3.19 MIL/uL — ABNORMAL LOW (ref 3.87–5.11)
RDW: 13.4 % (ref 11.5–15.5)
WBC: 15.7 10*3/uL — ABNORMAL HIGH (ref 4.0–10.5)
nRBC: 0 % (ref 0.0–0.2)

## 2021-11-04 MED ORDER — ONDANSETRON HCL 4 MG PO TABS: 4.0000 mg | ORAL_TABLET | ORAL | Status: DC | PRN

## 2021-11-04 MED ORDER — SENNOSIDES-DOCUSATE SODIUM 8.6-50 MG PO TABS
2.0000 | ORAL_TABLET | Freq: Every day | ORAL | Status: DC
  Administered 2021-11-04 – 2021-11-05 (×2): 2 via ORAL
  Filled 2021-11-04 (×2): qty 2

## 2021-11-04 MED ORDER — MEASLES, MUMPS & RUBELLA VAC IJ SOLR
0.5000 mL | INTRAMUSCULAR | Status: AC | PRN
  Administered 2021-11-05: 0.5 mL via SUBCUTANEOUS
  Filled 2021-11-04: qty 0.5

## 2021-11-04 MED ORDER — KETOROLAC TROMETHAMINE 30 MG/ML IJ SOLN
INTRAMUSCULAR | Status: AC
  Administered 2021-11-04: 15 mg
  Filled 2021-11-04: qty 1

## 2021-11-04 MED ORDER — IBUPROFEN 600 MG PO TABS
600.0000 mg | ORAL_TABLET | Freq: Four times a day (QID) | ORAL | Status: DC
  Administered 2021-11-04: 600 mg via ORAL
  Filled 2021-11-04: qty 1

## 2021-11-04 MED ORDER — COCONUT OIL OIL: 1.0000 | TOPICAL_OIL | Status: DC | PRN

## 2021-11-04 MED ORDER — DIPHENHYDRAMINE HCL 25 MG PO CAPS: 25.0000 mg | ORAL_CAPSULE | Freq: Four times a day (QID) | ORAL | Status: DC | PRN

## 2021-11-04 MED ORDER — DIBUCAINE (PERIANAL) 1 % EX OINT
1.0000 | TOPICAL_OINTMENT | CUTANEOUS | Status: DC | PRN
Start: 2021-11-04 — End: 2021-11-05

## 2021-11-04 MED ORDER — KETOROLAC TROMETHAMINE 15 MG/ML IJ SOLN
15.0000 mg | Freq: Four times a day (QID) | INTRAMUSCULAR | Status: AC
  Administered 2021-11-04: 15 mg via INTRAVENOUS
  Filled 2021-11-04 (×4): qty 1

## 2021-11-04 MED ORDER — WITCH HAZEL-GLYCERIN EX PADS: 1.0000 | MEDICATED_PAD | CUTANEOUS | Status: DC | PRN

## 2021-11-04 MED ORDER — PRENATAL MULTIVITAMIN CH
1.0000 | ORAL_TABLET | Freq: Every day | ORAL | Status: DC
Start: 2021-11-04 — End: 2021-11-05
  Administered 2021-11-04: 1 via ORAL
  Filled 2021-11-04: qty 1

## 2021-11-04 MED ORDER — TETANUS-DIPHTH-ACELL PERTUSSIS 5-2.5-18.5 LF-MCG/0.5 IM SUSY: 0.5000 mL | PREFILLED_SYRINGE | Freq: Once | INTRAMUSCULAR | Status: DC

## 2021-11-04 MED ORDER — ACETAMINOPHEN 325 MG PO TABS
650.0000 mg | ORAL_TABLET | ORAL | Status: DC | PRN
  Administered 2021-11-04 – 2021-11-05 (×3): 650 mg via ORAL
  Filled 2021-11-04 (×4): qty 2

## 2021-11-04 MED ORDER — METHYLERGONOVINE MALEATE 0.2 MG/ML IJ SOLN: 0.2000 mg | INTRAMUSCULAR | Status: DC | PRN

## 2021-11-04 MED ORDER — ZOLPIDEM TARTRATE 5 MG PO TABS: 5.0000 mg | ORAL_TABLET | Freq: Every evening | ORAL | Status: DC | PRN

## 2021-11-04 MED ORDER — SIMETHICONE 80 MG PO CHEW: 80.0000 mg | CHEWABLE_TABLET | ORAL | Status: DC | PRN

## 2021-11-04 MED ORDER — ONDANSETRON HCL 4 MG/2ML IJ SOLN: 4.0000 mg | INTRAMUSCULAR | Status: DC | PRN

## 2021-11-04 MED ORDER — SODIUM CHLORIDE 0.9 % IV SOLN
500.0000 mg | Freq: Once | INTRAVENOUS | Status: AC
  Administered 2021-11-04: 500 mg via INTRAVENOUS
  Filled 2021-11-04: qty 500

## 2021-11-04 MED ORDER — BENZOCAINE-MENTHOL 20-0.5 % EX AERO
1.0000 | INHALATION_SPRAY | CUTANEOUS | Status: DC | PRN
  Administered 2021-11-04: 1 via TOPICAL
  Filled 2021-11-04: qty 56

## 2021-11-04 MED ORDER — METHYLERGONOVINE MALEATE 0.2 MG PO TABS: 0.2000 mg | ORAL_TABLET | ORAL | Status: DC | PRN

## 2021-11-04 MED ORDER — OXYCODONE HCL 5 MG PO TABS
5.0000 mg | ORAL_TABLET | Freq: Four times a day (QID) | ORAL | Status: DC | PRN
  Administered 2021-11-04 – 2021-11-05 (×6): 5 mg via ORAL
  Filled 2021-11-04 (×6): qty 1

## 2021-11-04 NOTE — Anesthesia Postprocedure Evaluation (Signed)
Anesthesia Post Note  Patient: Jennifer Bird  Procedure(s) Performed: AN AD HOC LABOR EPIDURAL     Patient location during evaluation: Mother Baby Anesthesia Type: Epidural Level of consciousness: awake and alert Pain management: pain level controlled Vital Signs Assessment: post-procedure vital signs reviewed and stable Respiratory status: spontaneous breathing, nonlabored ventilation and respiratory function stable Cardiovascular status: stable Postop Assessment: no headache, no backache and epidural receding Anesthetic complications: no   No notable events documented.  Last Vitals:  Vitals:   11/04/21 0745 11/04/21 0845  BP: 132/87 115/76  Pulse: 71   Resp: 20   Temp: 36.9 C   SpO2: 100%     Last Pain:  Vitals:   11/04/21 0844  TempSrc:   PainSc: 8    Pain Goal:                   [facility] Records

## 2021-11-04 NOTE — Progress Notes (Signed)
The Rn called Dr Mayford Knife regarding patient's bleeding. Total blood loss in the last  5 hours was 486 ml plus 100 ml for clot. For a total of 586 mls.   In addition, Dr Mathis Fare came to check patient and will order meds.

## 2021-11-04 NOTE — Progress Notes (Addendum)
POSTPARTUM PROGRESS NOTE  Post Partum Day #1 s/p SVD  Subjective:  Jennifer Bird is a 26 y.o. G2P1011 s/p SVD at [redacted]w[redacted]d. RN reports significant bleeding this AM, myself and Dr. Mathis Fare presented to the bedside to evaluate and patient reports feeling comfortable, denies dizziness, lightheadedness, chest pain, SOB. Pt did pass some clot as well as has 3 pads that are moderately soaked with blood/questionable urine. Fundal massage reveals firm uterus and no appreciable vaginal bleeding.  Pt denies problems with ambulating, voiding or po intake.  She denies nausea or vomiting.  Pain is well controlled with oral pain medications.  She has had flatus. She has not had bowel movement.  Lochia Large.   Objective: Blood pressure 132/87, pulse 71, temperature 98.4 F (36.9 C), temperature source Oral, resp. rate 20, height 5\' 9"  (1.753 m), weight 100.7 kg, last menstrual period 01/20/2021, SpO2 100 %, unknown if currently breastfeeding.  Physical Exam:  General: alert, cooperative and no distress Chest: no respiratory distress Heart:regular rate, distal pulses intact Abdomen: soft, nontender,  Uterine Fundus: firm, appropriately tender, no appreciable vaginal bleeding with fundal massage DVT Evaluation: No calf swelling or tenderness Extremities: No peripheral edema Skin: warm, dry  Recent Labs    11/02/21 1314 11/04/21 0521  HGB 11.1* 9.2*  HCT 32.3* 27.2*    Assessment/Plan: Jennifer Bird is a 26 y.o. G2P1011 s/p SVD at [redacted]w[redacted]d   PPD#1 - Doing well, continue routine postpartum care. Blood loss not concerning at this time although given recent blood loss, will order PRN methergine series in the event that bleeding persists. Hgb 9.2 from 11.1, will order IV iron; reports hx of anemia and taking PO iron in the past.  Contraception: IUD at postpartum visit Feeding: Bottle Dispo: Plan for discharge within 24-48 hrs.   LOS: 2 days   03-11-1980, MD 11/04/2021, 7:55 AM

## 2021-11-04 NOTE — Progress Notes (Signed)
CSW met with MOB at beside to address consult for food insecurity. When CSW entered room, MOB was observed holding and bonding with infant. CSW introduced self and explained reason for consult. MOB was receptive to meeting with CSW and remained engaged during encounter. 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 MOB reports during pregnancy, she was not able to eat certain foods and reports she food pantry helped her have access to items such as soup and juice that she could stomach during pregnancy. MOB reports she receives WIC and food stamps. CSW encouraged MOB to contact WIC and social services to have infant added to benefits. MOB declined additional food bank resources at this time and declined additional resource needs. CSW encouraged MOB to contact social work if additional resource needs arise during MOB's admission. MOB thanked CSW.  Signed,  Issiac Jamar K. Rjay Revolorio, MSW, LCSWA, LCASA 11/04/2021 3:19 PM  

## 2021-11-05 MED ORDER — IBUPROFEN 600 MG PO TABS: 600.0000 mg | ORAL_TABLET | Freq: Four times a day (QID) | ORAL | 0 refills | Status: DC | PRN

## 2021-11-05 MED ORDER — ACETAMINOPHEN 500 MG PO TABS: 1000.0000 mg | ORAL_TABLET | Freq: Three times a day (TID) | ORAL | 0 refills | Status: DC | PRN

## 2021-11-05 NOTE — Plan of Care (Signed)
Discharge instructions given, pt receptive.  

## 2021-11-11 ENCOUNTER — Telehealth (HOSPITAL_COMMUNITY): Payer: Self-pay

## 2021-11-11 NOTE — Telephone Encounter (Signed)
Patient reports she is doing good. "I have had some small clots, but none now. How long does it take for the stitches to heal?" RN reviewed what to much bleeding looks like. "My bleeding has been ok." RN reviewed normal healing process of the perineum and peri care with patient. Patient declines any other questions/concerns about her health and healing.  Patient reports that baby is doing good. Eating, peeing/pooping, and gaining weight well. "When does the cord fall off?" RN reviewed cord care and bathing with patient. Baby sleeps in a bassinet. RN reviewed ABC's of safe sleep with patient. Patient declines any questions or concerns about baby.  EPDS- Explained EPDS to patient. Started to read question one and the phone line disconnected. Attempted to call patient back x2 and was unable to reach her. The voicemail mailbox is full.  Marcelino Duster [facility]  11/11/21,1553

## 2021-11-30 ENCOUNTER — Telehealth: Payer: Self-pay | Admitting: Family Medicine

## 2021-11-30 NOTE — Telephone Encounter (Signed)
Patient complaining of Headaches, [facility] Nurse went to her hose yesterday.

## 2021-12-01 NOTE — Telephone Encounter (Signed)
I called Jennifer Bird back and we discussed she has history of heaches/ migraines before pregnancy. She states she did not have a PCP, she would just go to ED as needed. She states she is using tylenol or ibuprofen for her headaches now and they do relieve her headaches. We discussed nurse visit and she states her blood pressure was good. She states she called to see if we can prescribe medication for headaches. I informed her in postpartum when you have headaches we are concerned for pre-eclampsia, but since her headache was relieved by tylenol, ibuprofen and bp was normal this time. Advised if she has severe headache unrelieved by tylenol or high blood pressure to go to hospital for evaluation for preeclampsia. Advised for longterm treatment of migraines advised she establish a PCP and we can provider referral if needed. She voices understanding. Nancy Fetter

## 2021-12-08 ENCOUNTER — Other Ambulatory Visit: Payer: Self-pay

## 2021-12-08 ENCOUNTER — Encounter: Payer: Self-pay | Admitting: Family Medicine

## 2021-12-08 ENCOUNTER — Ambulatory Visit (INDEPENDENT_AMBULATORY_CARE_PROVIDER_SITE_OTHER): Payer: Medicaid Other | Admitting: Family Medicine

## 2021-12-08 DIAGNOSIS — Z30430 Encounter for insertion of intrauterine contraceptive device: Secondary | ICD-10-CM | POA: Diagnosis not present

## 2021-12-08 DIAGNOSIS — Z2839 Other underimmunization status: Secondary | ICD-10-CM

## 2021-12-08 MED ORDER — LEVONORGESTREL 20.1 MCG/DAY IU IUD
1.0000 | INTRAUTERINE_SYSTEM | Freq: Once | INTRAUTERINE | Status: AC
  Administered 2021-12-08: 1 via INTRAUTERINE

## 2021-12-08 NOTE — Progress Notes (Signed)
    GYNECOLOGY OFFICE PROCEDURE NOTE  Jennifer Bird is a 26 y.o. G2P1011 here for Liletta IUD insertion. No GYN concerns.  Last pap smear:  Lab Results  Component Value Date   DIAGPAP  04/11/2021    - Negative for intraepithelial lesion or malignancy (NILM)    IUD Insertion Procedure Note Patient identified, informed consent performed, consent signed.   Discussed risks of irregular bleeding, increased cramping, infection, malpositioning or misplacement of the IUD outside the uterus which may require further procedure such as laparoscopy. Also discussed >99% contraception efficacy, increased risk of ectopic pregnancy with failure of method.  Time out was performed.  Speculum placed in the vagina.  Cervix visualized.  Cleaned with Betadine x 2.  Grasped anteriorly with a single tooth tenaculum.  Uterus sounded to 7 cm. IUD placed per manufacturer's recommendations.  Strings trimmed to 2-3 cm. Tenaculum was removed, good hemostasis noted.  Patient tolerated procedure well.   Patient was given post-procedure instructions.  She was advised to have backup contraception for one week.  Patient was also asked to check IUD strings periodically and follow up in 4 weeks for IUD check.  Venora Maples, MD/MPH Attending Family Medicine Physician, [facility] for [facility], [facility]

## 2021-12-08 NOTE — Progress Notes (Signed)
[facility] Partum Visit Note  Jennifer Bird is a 26 y.o. G37P1011 female who presents for a postpartum visit. She is 5 weeks postpartum following a normal spontaneous vaginal delivery.  I have fully reviewed the prenatal and intrapartum course. The delivery was at 66w0dgestational weeks.  Anesthesia: epidural. Postpartum course has been uneventful. Baby is doing well. Baby is feeding by bottle - Similac Advance. Bleeding staining only. Bowel function is normal. Bladder function is normal. Patient is not sexually active. Contraception method is none-anticipating IUD. Postpartum depression screening: negative.   The pregnancy intention screening data noted above was reviewed. Potential methods of contraception were discussed. The patient elected to proceed with No data recorded.   Edinburgh Postnatal Depression Scale - 12/08/21 0846       Edinburgh Postnatal Depression Scale:  In the Past 7 Days   I have been able to laugh and see the funny side of things. 0    I have looked forward with enjoyment to things. 0    I have blamed myself unnecessarily when things went wrong. 0    I have been anxious or worried for no good reason. 0    I have felt scared or panicky for no good reason. 0    Things have been getting on top of me. 0    I have been so unhappy that I have had difficulty sleeping. 0    I have felt sad or miserable. 0    I have been so unhappy that I have been crying. 0    The thought of harming myself has occurred to me. 0    Edinburgh Postnatal Depression Scale Total 0             Health Maintenance Due  Topic Date Due   COVID-19 Vaccine (1) Never done   HPV VACCINES (1 - 2-dose series) Never done   INFLUENZA VACCINE  11/21/2021    The following portions of the patient's history were reviewed and updated as appropriate: allergies, current medications, past family history, past medical history, past social history, past surgical history, and problem list.  Review of  Systems Pertinent items noted in HPI and remainder of comprehensive ROS otherwise negative.  Objective:  BP 116/83   Pulse 77   Wt 203 lb 6.4 oz (92.3 kg)   LMP 01/20/2021 (Within Days)   Breastfeeding No   BMI 30.04 kg/m    General:  alert, cooperative, and appears stated age   Breasts:  not indicated  Lungs: Comfortalbe on room air  Wound N/a  GU exam:  normal        Assessment:    There are no diagnoses linked to this encounter.  Normal postpartum exam.   Plan:   Essential components of care per ACOG recommendations:  1.  Mood and well being: Patient with negative depression screening today. Reviewed local resources for support.  - Patient tobacco use? No.   - hx of drug use? No.    2. Infant care and feeding:  -Patient currently breastmilk feeding? No.  -Social determinants of health (SDOH) reviewed in EPIC. The following needs were identified: food insecurity, offered market  3. Sexuality, contraception and birth spacing - Patient does not want a pregnancy in the next year.  Desired family size is 1 children.  - Reviewed reproductive life planning. Reviewed contraceptive methods based on pt preferences and effectiveness.  Patient desired IUD or IUS today.   - Discussed birth spacing of 18 months  4. Sleep and fatigue -Encouraged family/partner/community support of 4 hrs of uninterrupted sleep to help with mood and fatigue  5. Physical Recovery  - Discussed patients delivery and complications. She describes her labor as good. - Patient had a Vaginal, no problems at delivery. Patient had a 2nd degree laceration. Perineal healing reviewed. Patient expressed understanding - Patient has urinary incontinence? No. - Patient is safe to resume physical and sexual activity  6.  Health Maintenance - HM due items addressed No - up to date - Last pap smear  Diagnosis  Date Value Ref Range Status  04/11/2021   Final   - Negative for intraepithelial lesion or malignancy  (NILM)   Pap smear not done at today's visit.  -Breast Cancer screening indicated? No.   7. Chronic Disease/Pregnancy Condition follow up: Anemia - Taking PO iron - Rubella non immune: received MMR vaccine after delivery on 11/05/2021 - PCP follow up  Clarnce Flock, [place] for [place], [place]

## 2021-12-12 ENCOUNTER — Telehealth: Payer: Self-pay | Admitting: Family Medicine

## 2021-12-12 NOTE — Telephone Encounter (Signed)
Patient called want to know iuf Dr.Eckstat has gotten the information about her baby milk, she said the [facility] Office   haven't received anything yet.  Baby DOB 11/03/2021 Jennifer Bird.

## 2021-12-12 NOTE — Telephone Encounter (Signed)
Call placed to pt. Spoke with pt. States when here in the office last, needed [facility] approval letter for liquid formula instead of powder formula due to infant intolerance.  WIC form filled out and faxed to [facility] office in [place] today.  Pt advised to check with [facility] office later today . Pt agreeable to plan of care and verbalized understanding.  Braedyn Kauk,RNC

## 2022-01-10 ENCOUNTER — Encounter: Payer: Self-pay | Admitting: Family Medicine

## 2022-01-10 ENCOUNTER — Ambulatory Visit (INDEPENDENT_AMBULATORY_CARE_PROVIDER_SITE_OTHER): Payer: Medicaid Other | Admitting: Family Medicine

## 2022-01-10 DIAGNOSIS — Z975 Presence of (intrauterine) contraceptive device: Secondary | ICD-10-CM | POA: Diagnosis not present

## 2022-01-10 DIAGNOSIS — Z23 Encounter for immunization: Secondary | ICD-10-CM

## 2022-01-10 NOTE — Addendum Note (Signed)
Addended by: Brita Romp E on: 01/10/2022 11:29 AM   Modules accepted: Orders

## 2022-01-10 NOTE — Progress Notes (Signed)
   GYNECOLOGY OFFICE VISIT NOTE  History:   Jennifer Bird is a 26 y.o. G2P1011 here today for IUD string check.  Placed at postpartum visit on 10/17/9483, no complications Patient reports no issues since Currently has period No intercourse since last visit Has not seen strings  Health Maintenance Due  Topic Date Due   COVID-19 Vaccine (1) Never done   HPV VACCINES (1 - 2-dose series) Never done   INFLUENZA VACCINE  Never done    Past Medical History:  Diagnosis Date   Asthma    Bronchitis    COVID-19 05/07/2021   Headache    UTI (urinary tract infection)     Past Surgical History:  Procedure Laterality Date   NO PAST SURGERIES      The following portions of the patient's history were reviewed and updated as appropriate: allergies, current medications, past family history, past medical history, past social history, past surgical history and problem list.   Health Maintenance:   Last pap: Lab Results  Component Value Date   DIAGPAP  04/11/2021    - Negative for intraepithelial lesion or malignancy (NILM)     Last mammogram:  N/a    Review of Systems:  Pertinent items noted in HPI and remainder of comprehensive ROS otherwise negative.  Physical Exam:  BP 123/76   Pulse 88   Wt 205 lb 3.2 oz (93.1 kg)   Breastfeeding No   BMI 30.30 kg/m  CONSTITUTIONAL: Well-developed, well-nourished female in no acute distress.  HEENT:  Normocephalic, atraumatic. External right and left ear normal. No scleral icterus.  NECK: Normal range of motion, supple, no masses noted on observation SKIN: No rash noted. Not diaphoretic. No erythema. No pallor. MUSCULOSKELETAL: Normal range of motion. No edema noted. NEUROLOGIC: Alert and oriented to person, place, and time. Normal muscle tone coordination.  PSYCHIATRIC: Normal mood and affect. Normal behavior. Normal judgment and thought content. RESPIRATORY: Effort normal, no problems with respiration noted ABDOMEN: No masses  noted. No other overt distention noted.   PELVIC:  normal, small amount of blood, IUD strings visualized at 3 cm length consistent with placemnt length  Labs and Imaging No results found for this or any previous visit (from the past 168 hour(s)). No results found.    Assessment and Plan:   Problem List Items Addressed This Visit       Other   IUD (intrauterine device) in place    Well seated, discussed self checks q-month or can return to clinic PRN.        Routine preventative health maintenance measures emphasized. Please refer to After Visit Summary for other counseling recommendations.   Return for as needed.    Total face-to-face time with patient: 15 minutes.  Over 50% of encounter was spent on counseling and coordination of care.   Clarnce Flock, MD/MPH Attending Family Medicine Physician, [facility] for [facility], [place]

## 2022-01-10 NOTE — Assessment & Plan Note (Signed)
Well seated, discussed self checks q-month or can return to clinic PRN.

## 2022-02-06 ENCOUNTER — Telehealth: Payer: Self-pay | Admitting: Family Medicine

## 2022-02-06 DIAGNOSIS — Z111 Encounter for screening for respiratory tuberculosis: Secondary | ICD-10-CM

## 2022-02-06 NOTE — Telephone Encounter (Signed)
Patient called in wanting to get a TB test for her job and wants to know if she can be scheduled for that here.

## 2022-02-06 NOTE — Telephone Encounter (Signed)
Call placed to pt. Spoke with pt. Pt advised ok to come for TB lab in office. Pt verbalized understanding. Pt scheduled for lab on 10/18 at 930am. Pt agreeable to date and time of appt.  Future orders placed.  Colletta [place], RNC

## 2022-02-07 ENCOUNTER — Other Ambulatory Visit: Payer: Self-pay

## 2022-02-07 ENCOUNTER — Other Ambulatory Visit: Payer: Medicaid Other

## 2022-02-07 DIAGNOSIS — Z111 Encounter for screening for respiratory tuberculosis: Secondary | ICD-10-CM

## 2022-02-10 LAB — QUANTIFERON-TB GOLD PLUS
QuantiFERON Mitogen Value: >10 IU/mL
QuantiFERON Nil Value: 0.04 IU/mL
QuantiFERON TB1 Ag Value: 0.07 IU/mL
QuantiFERON TB2 Ag Value: 0.05 IU/mL
QuantiFERON-TB Gold Plus: NEGATIVE

## 2022-05-15 ENCOUNTER — Other Ambulatory Visit: Payer: Self-pay | Admitting: Obstetrics and Gynecology

## 2022-05-15 DIAGNOSIS — Z3490 Encounter for supervision of normal pregnancy, unspecified, unspecified trimester: Secondary | ICD-10-CM

## 2022-08-27 IMAGING — DX DG CHEST 1V PORT
1 series · 1 of 1 positions shown · non-contrast
Comparison: None.

CLINICAL DATA: chest pain, covid positive

EXAM:
PORTABLE CHEST 1 VIEW.  Slight patient rotation.

[chest]
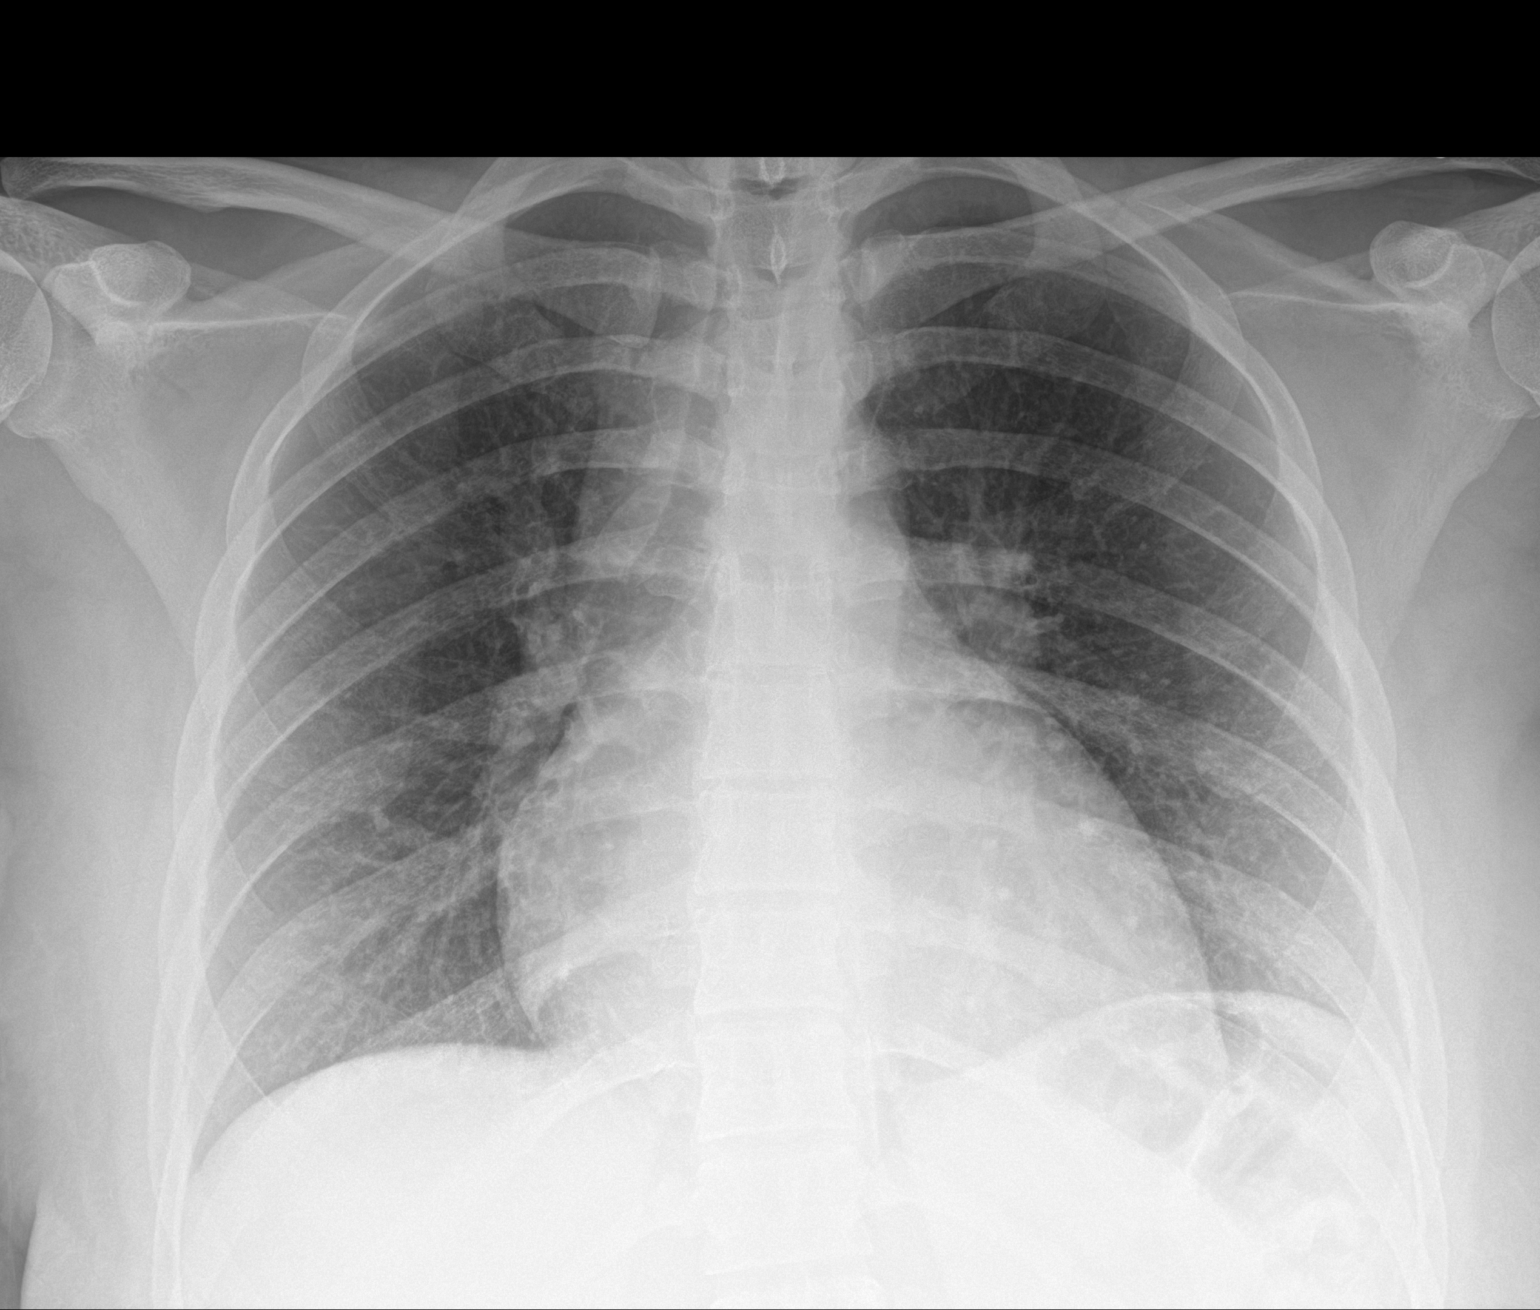

[1 of 1 positions shown; findings below may reference images not displayed]

FINDINGS: Slightly more prominent cardiac silhouette likely due to AP portable
technique. Otherwise the heart and mediastinal contours are
unchanged.

No focal consolidation. No pulmonary edema. No pleural effusion. No
pneumothorax.

No acute osseous abnormality.
IMPRESSION: No active disease.

## 2022-10-16 ENCOUNTER — Ambulatory Visit (INDEPENDENT_AMBULATORY_CARE_PROVIDER_SITE_OTHER): Payer: Medicaid Other

## 2022-10-16 ENCOUNTER — Encounter (HOSPITAL_COMMUNITY): Payer: Self-pay | Admitting: *Deleted

## 2022-10-16 ENCOUNTER — Other Ambulatory Visit: Payer: Self-pay

## 2022-10-16 ENCOUNTER — Ambulatory Visit (HOSPITAL_COMMUNITY)
Admission: EM | Admit: 2022-10-16 | Discharge: 2022-10-16 | Disposition: A | Discharge status: Home / Self Care | Payer: Medicaid Other | Attending: Family Medicine | Admitting: Family Medicine

## 2022-10-16 DIAGNOSIS — R509 Fever, unspecified: Secondary | ICD-10-CM

## 2022-10-16 DIAGNOSIS — R079 Chest pain, unspecified: Secondary | ICD-10-CM

## 2022-10-16 MED ORDER — IBUPROFEN 800 MG PO TABS: 800.0000 mg | ORAL_TABLET | Freq: Three times a day (TID) | ORAL | 0 refills | Status: DC

## 2022-10-16 MED ORDER — ACETAMINOPHEN 325 MG PO TABS
975.0000 mg | ORAL_TABLET | Freq: Once | ORAL | Status: AC
Start: 2022-10-16 — End: 2022-10-16
  Administered 2022-10-16: 975 mg via ORAL

## 2022-10-16 MED ORDER — ACETAMINOPHEN 325 MG PO TABS
ORAL_TABLET | ORAL | Status: AC
  Filled 2022-10-16: qty 3

## 2022-10-16 MED ORDER — ACETAMINOPHEN 500 MG PO TABS: 500.0000 mg | ORAL_TABLET | Freq: Four times a day (QID) | ORAL | 0 refills | Status: AC | PRN

## 2022-10-17 NOTE — ED Provider Notes (Signed)
[facility]   [id number] 10/16/22 Arrival Time: 1720  ASSESSMENT & PLAN:  1. Fever, unspecified fever cause   2. Chest pain, unspecified type     Patient history and exam consistent with non-cardiac cause of chest pain.  ECG: Performed today and interpreted by me: III, aVf with T-wave inversion; unchanged from previous ECG in chart; sinus tachycardia  I have personally viewed the imaging studies ordered this visit. No acute changes on CXR; no pneumothorax.  Discussed that I cannot r/o PE. Declines ED evaluation for CT angio.  Likely early viral illness. As needed: Meds ordered this encounter  Medications   acetaminophen (TYLENOL) tablet 975 mg   acetaminophen (TYLENOL) 500 MG tablet    Sig: Take 1 tablet (500 mg total) by mouth every 6 (six) hours as needed.    Dispense:  30 tablet    Refill:  0   ibuprofen (ADVIL) 800 MG tablet    Sig: Take 1 tablet (800 mg total) by mouth 3 (three) times daily with meals.    Dispense:  21 tablet    Refill:  0   Chest pain precautions given. Reviewed expectations re: course of current medical issues. Questions answered. Outlined signs and symptoms indicating need for more acute intervention. Patient verbalized understanding. After Visit Summary given.   SUBJECTIVE:  History from: patient. Jennifer Bird is a 27 y.o. female who reports mid sternal CP started 2 hrs ago. Pt reports CP radiates to her back and worsens with movements/speaking. Denies SOB. Mild body aches. Subj fever today. Fatigued. No tx PTA. Denies: irregular heart beat, lower extremity edema, near-syncope, orthopnea, palpitations, paroxysmal nocturnal dyspnea, and syncope. Denies recreational drug use.  Social History   Tobacco Use  Smoking Status Never  Smokeless Tobacco Never   Social History   Substance and Sexual Activity  Alcohol Use Not Currently   Alcohol/week: 1.0 standard drink of alcohol   Types: 1 Glasses of wine per week   Comment:  not whwile preg   OBJECTIVE:  Vitals:   10/16/22 1751  BP: 114/75  Pulse: (!) 117  Resp: 20  Temp: (!) 100.9 F (38.3 C)  SpO2: 98%    General appearance: alert, oriented, no acute distress; appears fatigued Eyes: PERRLA; EOMI; conjunctivae normal HENT: normocephalic; atraumatic Neck: supple with FROM Lungs: without labored respirations; speaks full sentences without difficulty; CTAB Heart: tachycardia Chest Wall: without tenderness to palpation Abdomen: soft, non-tender; no guarding or rebound tenderness Extremities: without edema; without calf swelling or tenderness; symmetrical without gross deformities Skin: warm and dry; without rash or lesions Neuro: normal gait Psychological: alert and cooperative; normal mood and affect  Labs: Results for orders placed or performed in visit on 02/07/22  QuantiFERON-TB Gold Plus  Result Value Ref Range   QuantiFERON Incubation Incubation performed.    QuantiFERON Criteria Comment    QuantiFERON TB1 Ag Value 0.07 IU/mL   QuantiFERON TB2 Ag Value 0.05 IU/mL   QuantiFERON Nil Value 0.04 IU/mL   QuantiFERON Mitogen Value >10.00 IU/mL   QuantiFERON-TB Gold Plus Negative Negative   Labs Reviewed - No data to display  Imaging: DG Chest 2 View  Result Date: 10/16/2022 CLINICAL DATA:  Chest pain. EXAM: CHEST - 2 VIEW COMPARISON:  May 10, 2021. FINDINGS: The heart size and mediastinal contours are within normal limits. Both lungs are clear. The visualized skeletal structures are unremarkable. IMPRESSION: No active cardiopulmonary disease. Electronically Signed   By: Lupita Raider M.D.   On: 10/16/2022 18:32  Allergies  Allergen Reactions   Codeine Shortness Of Breath and Swelling    Pt has taken other pain medications, including oxycodone without reaction.   Tomato Anaphylaxis    Past Medical History:  Diagnosis Date   Asthma    Bronchitis    COVID-19 05/07/2021   Headache    UTI (urinary tract infection)    Social  History   Socioeconomic History   Marital status: Single    Spouse name: Not on file   Number of children: Not on file   Years of education: Not on file   Highest education level: Not on file  Occupational History   Not on file  Tobacco Use   Smoking status: Never   Smokeless tobacco: Never  Vaping Use   Vaping Use: Never used  Substance and Sexual Activity   Alcohol use: Not Currently    Alcohol/week: 1.0 standard drink of alcohol    Types: 1 Glasses of wine per week    Comment: not whwile preg   Drug use: No   Sexual activity: Not Currently    Birth control/protection: None  Other Topics Concern   Not on file  Social History Narrative   Not on file   Social Determinants of Health   Financial Resource Strain: Not on file  Food Insecurity: Food Insecurity Present (12/08/2021)   Hunger Vital Sign    Worried About Running Out of Food in the Last Year: Sometimes true    Ran Out of Food in the Last Year: Sometimes true  Transportation Needs: No Transportation Needs (12/08/2021)   PRAPARE - Administrator, Civil Service (Medical): No    Lack of Transportation (Non-Medical): No  Physical Activity: Not on file  Stress: Not on file  Social Connections: Not on file  Intimate Partner Violence: Not on file   Family History  Problem Relation Age of Onset   Hypertension Mother    Cirrhosis Father    Past Surgical History:  Procedure Laterality Date   NO PAST SURGERIES        Mardella Layman, MD 10/17/22 [phone]

## 2022-11-08 ENCOUNTER — Other Ambulatory Visit: Payer: Self-pay

## 2022-11-08 DIAGNOSIS — F32A Depression, unspecified: Secondary | ICD-10-CM

## 2022-11-12 NOTE — BH Specialist Note (Unsigned)
Pt did not arrive to video visit and did not answer the phone; Unable to leave voice message; left MyChart message for patient.

## 2022-11-14 ENCOUNTER — Ambulatory Visit: Payer: Medicaid Other | Admitting: Clinical

## 2022-11-14 DIAGNOSIS — Z91199 Patient's noncompliance with other medical treatment and regimen due to unspecified reason: Secondary | ICD-10-CM

## 2022-11-20 ENCOUNTER — Encounter: Payer: Medicaid Other | Admitting: Family Medicine

## 2023-07-18 ENCOUNTER — Encounter: Payer: Self-pay | Admitting: Obstetrics and Gynecology

## 2023-07-24 ENCOUNTER — Ambulatory Visit

## 2023-08-12 ENCOUNTER — Other Ambulatory Visit (HOSPITAL_COMMUNITY)
Admission: RE | Admit: 2023-08-12 | Discharge: 2023-08-12 | Disposition: A | Discharge status: Home / Self Care | Source: Ambulatory Visit | Attending: Family Medicine | Admitting: Family Medicine

## 2023-08-12 ENCOUNTER — Other Ambulatory Visit: Payer: Self-pay

## 2023-08-12 ENCOUNTER — Ambulatory Visit: Admitting: *Deleted

## 2023-08-12 VITALS — BP 104/63 | HR 82 | Ht 69.5 in | Wt 212.0 lb

## 2023-08-12 DIAGNOSIS — N898 Other specified noninflammatory disorders of vagina: Secondary | ICD-10-CM | POA: Diagnosis present

## 2023-08-12 DIAGNOSIS — Z202 Contact with and (suspected) exposure to infections with a predominantly sexual mode of transmission: Secondary | ICD-10-CM | POA: Insufficient documentation

## 2023-08-12 LAB — POCT PREGNANCY, URINE: Preg Test, Ur: NEGATIVE

## 2023-08-12 NOTE — Progress Notes (Signed)
 Here for self swab  c/o whitish vaginal discharge and vaginal odor. Wants to also check for std's with blood tests. Also reports +pregnancy test at home and feels " weird". Self swab and lab work collected. UPT negative. Explained will be contacted if anything positive and needs treatment.  Jennifer Bird

## 2023-08-13 ENCOUNTER — Encounter: Payer: Self-pay | Admitting: Family Medicine

## 2023-08-13 LAB — HIV ANTIBODY (ROUTINE TESTING W REFLEX): HIV Screen 4th Generation wRfx: NONREACTIVE

## 2023-08-13 LAB — RPR: RPR Ser Ql: NONREACTIVE

## 2023-08-13 LAB — HEPATITIS B SURFACE ANTIGEN: Hepatitis B Surface Ag: NEGATIVE

## 2023-08-13 LAB — HEPATITIS C ANTIBODY: Hep C Virus Ab: NONREACTIVE

## 2023-08-14 LAB — CERVICOVAGINAL ANCILLARY ONLY
Bacterial Vaginitis (gardnerella): POSITIVE — AB
Candida Glabrata: NEGATIVE
Candida Vaginitis: NEGATIVE
Chlamydia: NEGATIVE
Comment: NEGATIVE
Comment: NEGATIVE
Comment: NEGATIVE
Comment: NEGATIVE
Comment: NEGATIVE
Comment: NORMAL
Neisseria Gonorrhea: NEGATIVE
Trichomonas: POSITIVE — AB

## 2023-08-15 ENCOUNTER — Encounter: Payer: Self-pay | Admitting: Family Medicine

## 2023-08-15 MED ORDER — METRONIDAZOLE 500 MG PO TABS: 500.0000 mg | ORAL_TABLET | Freq: Two times a day (BID) | ORAL | 0 refills | Status: AC

## 2023-08-16 ENCOUNTER — Telehealth: Payer: Self-pay

## 2023-08-16 NOTE — Telephone Encounter (Addendum)
 Called patient regarding recent lab results. Patient voiced that she will pick up medications today and denies any questions or concerns. Advised patient to contact our office for any questions or concerns.  Moira Andrews, RN  ----- Message from Abner Ables sent at 08/15/2023  6:59 PM EDT ----- Harvel Linear positive

## 2024-04-04 ENCOUNTER — Ambulatory Visit: Payer: Self-pay

## 2024-04-06 ENCOUNTER — Emergency Department (HOSPITAL_COMMUNITY)
Admission: EM | Admit: 2024-04-06 | Discharge: 2024-04-06 | Disposition: A | Discharge status: Home / Self Care | Attending: Emergency Medicine | Admitting: Emergency Medicine

## 2024-04-06 ENCOUNTER — Other Ambulatory Visit: Payer: Self-pay

## 2024-04-06 ENCOUNTER — Encounter (HOSPITAL_COMMUNITY): Payer: Self-pay

## 2024-04-06 DIAGNOSIS — J101 Influenza due to other identified influenza virus with other respiratory manifestations: Secondary | ICD-10-CM | POA: Diagnosis not present

## 2024-04-06 DIAGNOSIS — R059 Cough, unspecified: Secondary | ICD-10-CM | POA: Diagnosis not present

## 2024-04-06 DIAGNOSIS — R0981 Nasal congestion: Secondary | ICD-10-CM | POA: Diagnosis present

## 2024-04-06 DIAGNOSIS — R109 Unspecified abdominal pain: Secondary | ICD-10-CM | POA: Diagnosis not present

## 2024-04-06 LAB — RESP PANEL BY RT-PCR (RSV, FLU A&B, COVID)  RVPGX2
Influenza A by PCR: POSITIVE — AB
Influenza B by PCR: NEGATIVE
Resp Syncytial Virus by PCR: NEGATIVE
SARS Coronavirus 2 by RT PCR: NEGATIVE

## 2024-04-06 LAB — COMPREHENSIVE METABOLIC PANEL WITH GFR
ALT: 24 U/L (ref 0–44)
AST: 21 U/L (ref 15–41)
Albumin: 4.1 g/dL (ref 3.5–5.0)
Alkaline Phosphatase: 67 U/L (ref 38–126)
Anion gap: 9 (ref 5–15)
BUN: 11 mg/dL (ref 6–20)
CO2: 25 mmol/L (ref 22–32)
Calcium: 8.8 mg/dL — ABNORMAL LOW (ref 8.9–10.3)
Chloride: 105 mmol/L (ref 98–111)
Creatinine, Ser: 0.74 mg/dL (ref 0.44–1.00)
GFR, Estimated: >60 mL/min (ref >60)
Glucose, Bld: 97 mg/dL (ref 70–99)
Potassium: 3.9 mmol/L (ref 3.5–5.1)
Sodium: 138 mmol/L (ref 135–145)
Total Bilirubin: 0.3 mg/dL (ref 0.0–1.2)
Total Protein: 6.8 g/dL (ref 6.5–8.1)

## 2024-04-06 LAB — URINALYSIS, ROUTINE W REFLEX MICROSCOPIC
Bacteria, UA: NONE SEEN
Bilirubin Urine: NEGATIVE
Glucose, UA: NEGATIVE mg/dL
Ketones, ur: NEGATIVE mg/dL
Nitrite: NEGATIVE
Protein, ur: NEGATIVE mg/dL
Specific Gravity, Urine: 1.019 (ref 1.005–1.030)
pH: 6 (ref 5.0–8.0)

## 2024-04-06 LAB — CBC WITH DIFFERENTIAL/PLATELET
Abs Immature Granulocytes: 0.01 K/uL (ref 0.00–0.07)
Basophils Absolute: 0 K/uL (ref 0.0–0.1)
Basophils Relative: 1 %
Eosinophils Absolute: 0.2 K/uL (ref 0.0–0.5)
Eosinophils Relative: 4 %
HCT: 35.5 % — ABNORMAL LOW (ref 36.0–46.0)
Hemoglobin: 11.5 g/dL — ABNORMAL LOW (ref 12.0–15.0)
Immature Granulocytes: 0 %
Lymphocytes Relative: 20 %
Lymphs Abs: 0.9 K/uL (ref 0.7–4.0)
MCH: 28 pg (ref 26.0–34.0)
MCHC: 32.4 g/dL (ref 30.0–36.0)
MCV: 86.4 fL (ref 80.0–100.0)
Monocytes Absolute: 0.4 K/uL (ref 0.1–1.0)
Monocytes Relative: 9 %
Neutro Abs: 2.9 K/uL (ref 1.7–7.7)
Neutrophils Relative %: 66 %
Platelets: 217 K/uL (ref 150–400)
RBC: 4.11 MIL/uL (ref 3.87–5.11)
RDW: 12.5 % (ref 11.5–15.5)
WBC: 4.4 K/uL (ref 4.0–10.5)
nRBC: 0 % (ref 0.0–0.2)

## 2024-04-06 LAB — HCG, SERUM, QUALITATIVE: Preg, Serum: NEGATIVE

## 2024-04-06 LAB — LIPASE, BLOOD: Lipase: 49 U/L (ref 11–51)

## 2024-04-06 MED ORDER — IBUPROFEN 800 MG PO TABS: 800.0000 mg | ORAL_TABLET | Freq: Once | ORAL | Status: DC

## 2024-04-06 MED ORDER — IBUPROFEN 800 MG PO TABS: 800.0000 mg | ORAL_TABLET | Freq: Three times a day (TID) | ORAL | 0 refills | Status: AC | PRN

## 2024-04-06 NOTE — ED Provider Triage Note (Signed)
 Emergency Medicine Provider Triage Evaluation Note  Jennifer Bird , a 28 y.o. female  was evaluated in triage.  Pt complains of cough for 2 days.  Pt reports she is here with her son who became sick first.   Review of Systems  Positive: Cough and abdominal pain Negative: fever  Physical Exam  BP (!) 151/88   Pulse 90   Temp 100.2 F (37.9 C) (Oral)   Resp 19   SpO2 100%  Gen:   Awake, no distress   Resp:  Normal effort  MSK:   Moves extremities without difficulty  Other:    Medical Decision Making  Medically screening exam initiated at 8:34 PM.  Appropriate orders placed.  Avagrace Parrales was informed that the remainder of the evaluation will be completed by another provider, this initial triage assessment does not replace that evaluation, and the importance of remaining in the ED until their evaluation is complete.     Flint Sonny POUR, PA-C 04/06/24 2035

## 2024-04-06 NOTE — ED Triage Notes (Signed)
 Pt reports with lower abdominal pain and flu like symptoms since yesterday. Pt's son is also sick with flu like symptoms.

## 2024-04-06 NOTE — ED Notes (Signed)
 Patient requested STD check, sent down 2 gold top tubes to the lab.

## 2024-04-06 NOTE — Discharge Instructions (Addendum)
 Return if any problems.

## 2024-04-07 NOTE — ED Provider Notes (Signed)
 [place] EMERGENCY DEPARTMENT AT [facility] Provider Note   CSN: [id number] Arrival date & time: 04/06/24  8047     Patient presents with: Abdominal Pain   Jennifer Bird is a 28 y.o. female.   Patient complains of nausea cough and congestion.  Patient reports that she has been sick for the past 2 days she is here with her son who has been sick for the past 3 days.  Patient states she thinks that she caught something from him.  Patient complains of feeling nauseated.  She has not vomited.  Patient reports she has been having some abdominal cramping.  Patient is concerned that she could have RSV.  The history is provided by the patient. No language interpreter was used.  Abdominal Pain      Prior to Admission medications  Medication Sig Start Date End Date Taking? Authorizing Provider  ibuprofen  (ADVIL ) 800 MG tablet Take 1 tablet (800 mg total) by mouth every 8 (eight) hours as needed. 04/06/24  Yes Jahmal Dunavant K, PA-C  acetaminophen  (TYLENOL ) 500 MG tablet Take 1 tablet (500 mg total) by mouth every 6 (six) hours as needed. 10/16/22   Rolinda Rogue, MD  metroNIDAZOLE  (FLAGYL ) 500 MG tablet Take 1 tablet (500 mg total) by mouth 2 (two) times daily. 08/15/23   Eldonna Suzen Octave, MD  diphenhydrAMINE  (BENADRYL ) 25 mg capsule Take 1 capsule (25 mg total) by mouth every 6 (six) hours as needed. Patient not taking: Reported on 07/23/2016 06/21/16 10/17/18  Nevelyn Nat Norris, PA-C    Allergies: Codeine and Tomato    Review of Systems  Gastrointestinal:  Positive for abdominal pain.  All other systems reviewed and are negative.   Updated Vital Signs BP (!) 151/88   Pulse 90   Temp 100.2 F (37.9 C) (Oral)   Resp 18   SpO2 100%   Physical Exam Vitals and nursing note reviewed.  Constitutional:      Appearance: She is well-developed.  HENT:     Head: Normocephalic.  Eyes:     Extraocular Movements: Extraocular movements intact.  Cardiovascular:      Rate and Rhythm: Normal rate.  Pulmonary:     Effort: Pulmonary effort is normal.  Abdominal:     General: Abdomen is flat. Bowel sounds are normal. There is no distension.     Palpations: Abdomen is soft.     Tenderness: There is no abdominal tenderness.  Musculoskeletal:        General: Normal range of motion.     Cervical back: Normal range of motion.  Skin:    General: Skin is warm.  Neurological:     General: No focal deficit present.     Mental Status: She is alert and oriented to person, place, and time.  Psychiatric:        Mood and Affect: Mood normal.     (all labs ordered are listed, but only abnormal results are displayed) Labs Reviewed  RESP PANEL BY RT-PCR (RSV, FLU A&B, COVID)  RVPGX2 - Abnormal; Notable for the following components:      Result Value   Influenza A by PCR POSITIVE (*)    All other components within normal limits  COMPREHENSIVE METABOLIC PANEL WITH GFR - Abnormal; Notable for the following components:   Calcium 8.8 (*)    All other components within normal limits  CBC WITH DIFFERENTIAL/PLATELET - Abnormal; Notable for the following components:   Hemoglobin 11.5 (*)    HCT 35.5 (*)  All other components within normal limits  URINALYSIS, ROUTINE W REFLEX MICROSCOPIC - Abnormal; Notable for the following components:   Hgb urine dipstick MODERATE (*)    Leukocytes,Ua TRACE (*)    All other components within normal limits  LIPASE, BLOOD  HCG, SERUM, QUALITATIVE    EKG: None  Radiology: No results found.   Procedures   Medications Ordered in the ED - No data to display                                  Medical Decision Making Amount and/or Complexity of Data Reviewed Labs: ordered. Decision-making details documented in ED Course.  Risk Prescription drug management. Risk Details: Patient is counseled on symptomatic treatment.  Patient is given a prescription for 800 mg of ibuprofen  she is given a dose here.  Patient is advised to  return if any problems.  She is discharged in stable condition     Final diagnoses:  Influenza A    ED Discharge Orders          Ordered    ibuprofen  (ADVIL ) 800 MG tablet  Every 8 hours PRN        04/06/24 2225            An After Visit Summary was printed and given to the patient.    Flint Sonny POUR, PA-C 04/07/24 1616    Randol Simmonds, MD 04/08/24 [phone]
